# Patient Record
Sex: Male | Born: 1975 | Race: White | Hispanic: No | Marital: Married | Smoking: Never smoker
Health system: Southern US, Community
[De-identification: ages and names within clinical notes are randomized; demographics above are authoritative.]

## PROBLEM LIST (undated history)

## (undated) DIAGNOSIS — T4145XA Adverse effect of unspecified anesthetic, initial encounter: Secondary | ICD-10-CM

## (undated) DIAGNOSIS — T8859XA Other complications of anesthesia, initial encounter: Secondary | ICD-10-CM

## (undated) DIAGNOSIS — R0989 Other specified symptoms and signs involving the circulatory and respiratory systems: Secondary | ICD-10-CM

## (undated) HISTORY — PX: COLONOSCOPY: SHX174

---

## 2015-01-16 ENCOUNTER — Other Ambulatory Visit: Payer: Self-pay | Admitting: Otolaryngology

## 2015-01-16 DIAGNOSIS — R221 Localized swelling, mass and lump, neck: Secondary | ICD-10-CM

## 2015-01-24 ENCOUNTER — Ambulatory Visit
Admission: RE | Admit: 2015-01-24 | Discharge: 2015-01-24 | Disposition: A | Discharge status: Home / Self Care | Payer: 59 | Source: Ambulatory Visit | Attending: Otolaryngology | Admitting: Otolaryngology

## 2015-01-24 DIAGNOSIS — M542 Cervicalgia: Secondary | ICD-10-CM | POA: Insufficient documentation

## 2015-01-24 DIAGNOSIS — R221 Localized swelling, mass and lump, neck: Secondary | ICD-10-CM | POA: Diagnosis not present

## 2015-01-24 MED ORDER — IOHEXOL 350 MG/ML SOLN
75.0000 mL | Freq: Once | INTRAVENOUS | Status: AC | PRN
  Administered 2015-01-24: 75 mL via INTRAVENOUS

## 2015-03-30 ENCOUNTER — Encounter: Payer: Self-pay | Admitting: *Deleted

## 2015-04-05 NOTE — Discharge Instructions (Signed)
[facility] °[facility] °ENDOSCOPIC SINUS SURGERY °[facility] ° °What is Functional Endoscopic Sinus Surgery? ° The Surgery involves making the natural openings of the sinuses larger by removing the bony partitions that separate the sinuses from the nasal cavity.  The natural sinus lining is preserved as much as possible to allow the sinuses to resume normal function after the surgery.  In some patients nasal polyps (excessively swollen lining of the sinuses) may be removed to relieve obstruction of the sinus openings.  The surgery is performed through the nose using lighted scopes, which eliminates the need for incisions on the face.  A septoplasty is a different procedure which is sometimes performed with sinus surgery.  It involves straightening the boy partition that separates the two sides of your nose.  A crooked or deviated septum may need repair if is obstructing the sinuses or nasal airflow.  Turbinate reduction is also often performed during sinus surgery.  The turbinates are bony proturberances from the side walls of the nose which swell and can obstruct the nose in patients with sinus and allergy problems.  Their size can be surgically reduced to help relieve nasal obstruction. ° °What Can Sinus Surgery Do For Me? ° Sinus surgery can reduce the frequency of sinus infections requiring antibiotic treatment.  This can provide improvement in nasal congestion, post-nasal drainage, facial pressure and nasal obstruction.  Surgery will NOT prevent you from ever having an infection again, so it usually only for patients who get infections 4 or more times yearly requiring antibiotics, or for infections that do not clear with antibiotics.  It will not cure nasal allergies, so patients with allergies may still require medication to treat their allergies after surgery. Surgery may improve headaches related to sinusitis, however, some people will continue to  require medication to control sinus headaches related to allergies.  Surgery will do nothing for other forms of headache (migraine, tension or cluster). ° °What Are the Risks of Endoscopic Sinus Surgery? ° Current techniques allow surgery to be performed safely with little risk, however, there are rare complications that patients should be aware of.  Because the sinuses are located around the eyes, there is risk of eye injury, including blindness, though again, this would be quite rare. This is usually a result of bleeding behind the eye during surgery, which puts the vision oat risk, though there are treatments to protect the vision and prevent permanent disrupted by surgery causing a leak of the spinal fluid that surrounds the brain.  More serious complications would include bleeding inside the brain cavity or damage to the brain.  Again, all of these complications are uncommon, and spinal fluid leaks can be safely managed surgically if they occur.  The most common complication of sinus surgery is bleeding from the nose, which may require packing or cauterization of the nose.  Continued sinus have polyps may experience recurrence of the polyps requiring revision surgery.  Alterations of sense of smell or injury to the tear ducts are also rare complications.  ° °What is the Surgery Like, and what is the Recovery? ° The Surgery usually takes a couple of hours to perform, and is usually performed under a general anesthetic (completely asleep).  Patients are usually discharged home after a couple of hours.  Sometimes during surgery it is necessary to pack the nose to control bleeding, and the packing is left in place for 24 - 48 hours, and removed by your surgeon.    If a septoplasty was performed during the procedure, there is often a splint placed which must be removed after 5-7 days.   °Discomfort: Pain is usually mild to moderate, and can be controlled by prescription pain medication or acetaminophen (Tylenol).   Aspirin, Ibuprofen (Advil, Motrin), or Naprosyn (Aleve) should be avoided, as they can cause increased bleeding.  Most patients feel sinus pressure like they have a bad head cold for several days.  Sleeping with your head elevated can help reduce swelling and facial pressure, as can ice packs over the face.  A humidifier may be helpful to keep the mucous and blood from drying in the nose.  ° °Diet: There are no specific diet restrictions, however, you should generally start with clear liquids and a light diet of bland foods because the anesthetic can cause some nausea.  Advance your diet depending on how your stomach feels.  Taking your pain medication with food will often help reduce stomach upset which pain medications can cause. ° °Nasal Saline Irrigation: It is important to remove blood clots and dried mucous from the nose as it is healing.  This is done by having you irrigate the nose at least 3 - 4 times daily with a salt water solution.  We recommend using NeilMed Sinus Rinse (available at the drug store).  Fill the squeeze bottle with the solution, bend over a sink, and insert the tip of the squeeze bottle into the nose ½ of an inch.  Point the tip of the squeeze bottle towards the inside corner of the eye on the same side your irrigating.  Squeeze the bottle and gently irrigate the nose.  If you bend forward as you do this, most of the fluid will flow back out of the nose, instead of down your throat.   The solution should be warm, near body temperature, when you irrigate.   Each time you irrigate, you should use a full squeeze bottle.  ° °Note that if you are instructed to use Nasal Steroid Sprays at any time after your surgery, irrigate with saline BEFORE using the steroid spray, so you do not wash it all out of the nose. °Another product, Nasal Saline Gel (such as AYR Nasal Saline Gel) can be applied in each nostril 3 - 4 times daily to moisture the nose and reduce scabbing or crusting. ° °Bleeding:   Bloody drainage from the nose can be expected for several days, and patients are instructed to irrigate their nose frequently with salt water to help remove mucous and blood clots.  The drainage may be dark red or brown, though some fresh blood may be seen intermittently, especially after irrigation.  Do not blow you nose, as bleeding may occur. If you must sneeze, keep your mouth open to allow air to escape through your mouth. ° °If heavy bleeding occurs: Irrigate the nose with saline to rinse out clots, then spray the nose 3 - 4 times with Afrin Nasal Decongestant Spray.  The spray will constrict the blood vessels to slow bleeding.  Pinch the lower half of your nose shut to apply pressure, and lay down with your head elevated.  Ice packs over the nose may help as well. If bleeding persists despite these measures, you should notify your doctor.  Do not use the Afrin routinely to control nasal congestion after surgery, as it can result in worsening congestion and may affect healing.  ° ° ° °Activity: Return to work varies among patients. Most patients will be   out of work at least 5 - 7 days to recover.  Patient may return to work after they are off of narcotic pain medication, and feeling well enough to perform the functions of their job.  Patients must avoid heavy lifting (over 10 pounds) or strenuous physical for 2 weeks after surgery, so your employer may need to assign you to light duty, or keep you out of work longer if light duty is not possible.  NOTE: you should not drive, operate dangerous machinery, do any mentally demanding tasks or make any important legal or financial decisions while on narcotic pain medication and recovering from the general anesthetic.  °  °Call Your Doctor Immediately if You Have Any of the Following: °1. Bleeding that you cannot control with the above measures °2. Loss of vision, double vision, bulging of the eye or black eyes. °3. Fever over 101 degrees °4. Neck stiffness with  severe headache, fever, nausea and change in mental state. °You are always encourage to call anytime with concerns, however, please call with requests for pain medication refills during office hours. ° °Office Endoscopy: During follow-up visits your doctor will remove any packing or splints that may have been placed and evaluate and clean your sinuses endoscopically.  Topical anesthetic will be used to make this as comfortable as possible, though you may want to take your pain medication prior to the visit.  How often this will need to be done varies from patient to patient.  After complete recovery from the surgery, you may need follow-up endoscopy from time to time, particularly if there is concern of recurrent infection or nasal polyps. ° °General Anesthesia, Adult, Care After °Refer to this sheet in the next few weeks. These instructions provide you with information on caring for yourself after your procedure. Your health care provider may also give you more specific instructions. Your treatment has been planned according to current medical practices, but problems sometimes occur. Call your health care provider if you have any problems or questions after your procedure. °WHAT TO EXPECT AFTER THE PROCEDURE °After the procedure, it is typical to experience: °· Sleepiness. °· Nausea and vomiting. °HOME CARE INSTRUCTIONS °· For the first 24 hours after general anesthesia: °¨ Have a responsible person with you. °¨ Do not drive a car. If you are alone, do not take public transportation. °¨ Do not drink alcohol. °¨ Do not take medicine that has not been prescribed by your health care provider. °¨ Do not sign important papers or make important decisions. °¨ You may resume a normal diet and activities as directed by your health care provider. °· Change bandages (dressings) as directed. °· If you have questions or problems that seem related to general anesthesia, call the hospital and ask for the anesthetist or  anesthesiologist on call. °SEEK MEDICAL CARE IF: °· You have nausea and vomiting that continue the day after anesthesia. °· You develop a rash. °SEEK IMMEDIATE MEDICAL CARE IF:  °· You have difficulty breathing. °· You have chest pain. °· You have any allergic problems. °  °This information is not intended to replace advice given to you by your health care provider. Make sure you discuss any questions you have with your health care provider. °  °Document Released: 08/11/2000 Document Revised: 05/26/2014 Document Reviewed: 09/03/2011 °Elsevier Interactive Patient Education ©2016 [employer] ° °

## 2015-05-03 ENCOUNTER — Encounter: Payer: Self-pay | Admitting: *Deleted

## 2015-05-09 ENCOUNTER — Ambulatory Visit
Admission: RE | Admit: 2015-05-09 | Discharge: 2015-05-09 | Disposition: A | Discharge status: Home / Self Care | Payer: 59 | Source: Ambulatory Visit | Attending: Otolaryngology | Admitting: Otolaryngology

## 2015-05-09 ENCOUNTER — Encounter
Admission: RE | Disposition: A | Discharge status: Home / Self Care | Payer: 59 | Source: Ambulatory Visit | Attending: Otolaryngology

## 2015-05-09 ENCOUNTER — Ambulatory Visit: Payer: 59 | Admitting: Anesthesiology

## 2015-05-09 DIAGNOSIS — J320 Chronic maxillary sinusitis: Secondary | ICD-10-CM | POA: Insufficient documentation

## 2015-05-09 HISTORY — PX: MAXILLARY ANTROSTOMY: SHX2003

## 2015-05-09 HISTORY — PX: IMAGE GUIDED SINUS SURGERY: SHX6570

## 2015-05-09 HISTORY — DX: Adverse effect of unspecified anesthetic, initial encounter: T41.45XA

## 2015-05-09 HISTORY — DX: Other complications of anesthesia, initial encounter: T88.59XA

## 2015-05-09 HISTORY — DX: Other specified symptoms and signs involving the circulatory and respiratory systems: R09.89

## 2015-05-09 SURGERY — SINUS SURGERY, WITH IMAGING GUIDANCE
Anesthesia: General | Site: Nose | Laterality: Right | Wound class: Clean Contaminated

## 2015-05-09 MED ORDER — DEXAMETHASONE SODIUM PHOSPHATE 4 MG/ML IJ SOLN
INTRAMUSCULAR | Status: DC | PRN
  Administered 2015-05-09: 10 mg via INTRAVENOUS

## 2015-05-09 MED ORDER — PROPOFOL 10 MG/ML IV BOLUS
INTRAVENOUS | Status: DC | PRN
  Administered 2015-05-09: 50 mg via INTRAVENOUS
  Administered 2015-05-09: 150 mg via INTRAVENOUS

## 2015-05-09 MED ORDER — OXYCODONE HCL 5 MG PO TABS: 5.0000 mg | ORAL_TABLET | Freq: Once | ORAL | Status: AC | PRN

## 2015-05-09 MED ORDER — LACTATED RINGERS IV SOLN: 500.0000 mL | INTRAVENOUS | Status: DC

## 2015-05-09 MED ORDER — HYDROCODONE-ACETAMINOPHEN 5-325 MG PO TABS: 1.0000 | ORAL_TABLET | ORAL | Status: DC | PRN

## 2015-05-09 MED ORDER — LIDOCAINE HCL (CARDIAC) 20 MG/ML IV SOLN
INTRAVENOUS | Status: DC | PRN
  Administered 2015-05-09: 40 mg via INTRAVENOUS

## 2015-05-09 MED ORDER — ACETAMINOPHEN 160 MG/5ML PO SOLN: 325.0000 mg | ORAL | Status: DC | PRN

## 2015-05-09 MED ORDER — GLYCOPYRROLATE 0.2 MG/ML IJ SOLN
INTRAMUSCULAR | Status: DC | PRN
  Administered 2015-05-09: .1 mg via INTRAVENOUS

## 2015-05-09 MED ORDER — LACTATED RINGERS IV SOLN
INTRAVENOUS | Status: DC
  Administered 2015-05-09: 07:00:00 via INTRAVENOUS

## 2015-05-09 MED ORDER — LIDOCAINE-EPINEPHRINE 1 %-1:100000 IJ SOLN
INTRAMUSCULAR | Status: DC | PRN
  Administered 2015-05-09: 5 mL

## 2015-05-09 MED ORDER — SULFAMETHOXAZOLE-TRIMETHOPRIM 800-160 MG PO TABS: 1.0000 | ORAL_TABLET | Freq: Two times a day (BID) | ORAL | Status: DC

## 2015-05-09 MED ORDER — PROMETHAZINE HCL 12.5 MG PO TABS: 12.5000 mg | ORAL_TABLET | Freq: Four times a day (QID) | ORAL | Status: DC | PRN

## 2015-05-09 MED ORDER — ACETAMINOPHEN 10 MG/ML IV SOLN
INTRAVENOUS | Status: DC | PRN
  Administered 2015-05-09: 1000 mg via INTRAVENOUS

## 2015-05-09 MED ORDER — ONDANSETRON HCL 4 MG/2ML IJ SOLN
INTRAMUSCULAR | Status: DC | PRN
  Administered 2015-05-09: 4 mg via INTRAVENOUS

## 2015-05-09 MED ORDER — ONDANSETRON HCL 4 MG/2ML IJ SOLN: 4.0000 mg | Freq: Once | INTRAMUSCULAR | Status: DC | PRN

## 2015-05-09 MED ORDER — SUCCINYLCHOLINE CHLORIDE 20 MG/ML IJ SOLN
INTRAMUSCULAR | Status: DC | PRN
  Administered 2015-05-09: 100 mg via INTRAVENOUS

## 2015-05-09 MED ORDER — ACETAMINOPHEN 325 MG PO TABS: 325.0000 mg | ORAL_TABLET | ORAL | Status: DC | PRN

## 2015-05-09 MED ORDER — OXYCODONE HCL 5 MG/5ML PO SOLN
5.0000 mg | Freq: Once | ORAL | Status: AC | PRN
  Administered 2015-05-09: 5 mg via ORAL

## 2015-05-09 MED ORDER — FENTANYL CITRATE (PF) 100 MCG/2ML IJ SOLN
INTRAMUSCULAR | Status: DC | PRN
  Administered 2015-05-09: 25 ug via INTRAVENOUS
  Administered 2015-05-09: 100 ug via INTRAVENOUS

## 2015-05-09 MED ORDER — OXYMETAZOLINE HCL 0.05 % NA SOLN
NASAL | Status: DC | PRN
  Administered 2015-05-09: 1 via TOPICAL

## 2015-05-09 MED ORDER — MIDAZOLAM HCL 5 MG/5ML IJ SOLN
INTRAMUSCULAR | Status: DC | PRN
  Administered 2015-05-09: 2 mg via INTRAVENOUS

## 2015-05-09 MED ORDER — HYDROMORPHONE HCL 1 MG/ML IJ SOLN: 0.2500 mg | INTRAMUSCULAR | Status: DC | PRN

## 2015-05-09 SURGICAL SUPPLY — 28 items
BALLOON SINUPLASTY SYSTEM (BALLOONS) IMPLANT
BATTERY INSTRU NAVIGATION (MISCELLANEOUS) ×16 IMPLANT
BLADE IRRIGATOR 40D CVD (IRRIGATION / IRRIGATOR) ×4 IMPLANT
CANISTER SUCT 1200ML W/VALVE (MISCELLANEOUS) ×4 IMPLANT
COAG SUCT 10F 3.5MM HAND CTRL (MISCELLANEOUS) ×4 IMPLANT
DEVICE INFLATION SEID (MISCELLANEOUS) IMPLANT
DRAPE HEAD BAR (DRAPES) ×4 IMPLANT
DRESSING NASL FOAM PST OP SINU (MISCELLANEOUS) ×2 IMPLANT
DRSG NASAL 4CM NASOPORE (MISCELLANEOUS) IMPLANT
DRSG NASAL FOAM POST OP SINU (MISCELLANEOUS) ×4
GLOVE BIO SURGEON STRL SZ7.5 (GLOVE) ×12 IMPLANT
IRRIGATOR 4MM STR (IRRIGATION / IRRIGATOR) ×4 IMPLANT
IV NS 500ML (IV SOLUTION) ×2
IV NS 500ML BAXH (IV SOLUTION) ×2 IMPLANT
KIT ROOM TURNOVER OR (KITS) ×4 IMPLANT
NAVIGATION MASK REG  ST (MISCELLANEOUS) ×4 IMPLANT
NS IRRIG 500ML POUR BTL (IV SOLUTION) IMPLANT
PACK DRAPE NASAL/ENT (PACKS) ×4 IMPLANT
PACKING NASAL EPIS 4X2.4 XEROG (MISCELLANEOUS) ×4 IMPLANT
PAD GROUND ADULT SPLIT (MISCELLANEOUS) ×4 IMPLANT
PATTIES SURGICAL .5 X3 (DISPOSABLE) ×4 IMPLANT
SINUPLASTY SPHENOID GUIDE (MISCELLANEOUS) IMPLANT
SOL ANTI-FOG 6CC FOG-OUT (MISCELLANEOUS) ×2 IMPLANT
SOL FOG-OUT ANTI-FOG 6CC (MISCELLANEOUS) ×2
STRAP BODY AND KNEE 60X3 (MISCELLANEOUS) ×8 IMPLANT
SYR BULB EAR ULCER 3OZ GRN STR (SYRINGE) ×4 IMPLANT
SYRINGE 10CC LL (SYRINGE) ×4 IMPLANT
WATER STERILE IRR 500ML POUR (IV SOLUTION) ×4 IMPLANT

## 2015-05-09 NOTE — Transfer of Care (Signed)
Immediate Anesthesia Transfer of Care Note  Patient: Matthew Levy  Procedure(s) Performed: Procedure(s) with comments: IMAGE GUIDED SINUS SURGERY (N/A) - GAVE DISK TO [facility] 11/10 MAXILLARY ANTROSTOMY (Right) - NO SPECIMENS SENT PER MD  Patient Location: PACU  Anesthesia Type: General  Level of Consciousness: awake, alert  and patient cooperative  Airway and Oxygen Therapy: Patient Spontanous Breathing and Patient connected to supplemental oxygen  Post-op Assessment: Post-op Vital signs reviewed, Patient's Cardiovascular Status Stable, Respiratory Function Stable, Patent Airway and No signs of Nausea or vomiting  Post-op Vital Signs: Reviewed and stable  Complications: No apparent anesthesia complications

## 2015-05-09 NOTE — H&P (Signed)
..  History and Physical paper copy reviewed and updated date of procedure and will be scanned into system.  

## 2015-05-09 NOTE — Anesthesia Postprocedure Evaluation (Signed)
Anesthesia Post Note  Patient: Matthew Levy  Procedure(s) Performed: Procedure(s) (LRB): IMAGE GUIDED SINUS SURGERY (N/A) MAXILLARY ANTROSTOMY (Right)  Patient location during evaluation: PACU Anesthesia Type: General Level of consciousness: awake and alert Pain management: pain level controlled Vital Signs Assessment: post-procedure vital signs reviewed and stable Respiratory status: spontaneous breathing, nonlabored ventilation, respiratory function stable and patient connected to nasal cannula oxygen Cardiovascular status: blood pressure returned to baseline and stable Postop Assessment: no signs of nausea or vomiting Anesthetic complications: no    DANIEL D KOVACS

## 2015-05-09 NOTE — Op Note (Signed)
..  05/09/2015  8:38 AM    Randa SpikeForrest Rosaura CarpenterBlackwood  [id number]   Pre-Op Dx:  Chronic Rhinosinusitis refractory to medical treatment  Post-op Dx: Same  Proc:   1)  Image Guided Sinus Surgery,  2)  Right Maxillary Antrostomy   Surg:  Danyelle [facility]over  Anes:  General  EBL:  50  Comp:  None  Findings: purulence and accessory ostia of right maxillary sinus  Procedure: After the patient was identified in holding and the benefits of the procedure were reviewed as well as the consent and risks.  The patient was taken to the operating room and with the patient in a comfortable supine position,  general orotracheal anesthesia was induced without difficulty.  A proper time-out was performed.  The Stryker image guidance system was set up and calibrated in the normal fashion with an acceptable error of 0.46mm.       The patient next received preoperative Afrin spray for topical decongestion and vasoconstriction and 1% Xylocaine with 1:100,000 epinephrine, 3 cc's, was infiltrated into the inferior turbinates, septum, and anterior middle turbinates bilaterally.  Several minutes were allowed for this to take effect.  Cottoniod pledgets soaked in Afrin were placed into both nasal cavities and left while the patient was prepped and draped in the standard fashion.  The materials were removed from the nose and observed to be intact and correct in number.  The nose was next inspected with a zero degree endoscope and the middle turbinates were medialized and afrin soaked pledgets were placed lateral to the turbinates for approximately one minute.  At this time attention was directed to the patient's right maxillary sinus.  On the right, a ball tipped probe was placed through the natural ostia and this was used to create a larger opening.  An accessory ostia was noted with purulence.  Using a Diego microdebrider, the maxillary antrostomy was enlarged for a widely patent maxillary antrostomy.  Hemostasis was  performed with topical Afrin soaked pledgets.  Visualization with a zero degree endoscope was used to examine the maxillary antrostomy which were noted to be widely patent and in continuity with the natural os bilaterally.  At this time with the diseased sinuses opened, the patient's nasal cavity was examinated and copiously irrigated with sterile saline.  Meticulous hemostasis was continued and all sinuses were examined and noted to be widely patent.    Stamberger sinufoam and Xerogel was placed lateral to the middle turbinate on the right side to aid in continued hemostasis and medialization of the middle turbinate.  Care of the patient at this time was transferred to anesthesia and was extubated and taken to PACU in good condition.  Dispo:   PACU to home  Plan: Ice, elevation, narcotic analgesia and prophylactic antibiotics.  We will reevaluate the patient in the office in 7 days.  Return to work in 7-10 days, strenuous activities in two weeks.   Branden Vine 05/09/2015 8:38 AM

## 2015-05-09 NOTE — Anesthesia Preprocedure Evaluation (Signed)
Anesthesia Evaluation  Patient identified by MRN, date of birth, ID band Patient awake    Reviewed: Allergy & Precautions, H&P , NPO status , Patient's Chart, lab work & pertinent test results, reviewed documented beta blocker date and time   History of Anesthesia Complications (+) history of anesthetic complications  Airway Mallampati: III  TM Distance: >3 FB Neck ROM: full    Dental no notable dental hx.    Pulmonary neg pulmonary ROS,    Pulmonary exam normal breath sounds clear to auscultation       Cardiovascular Exercise Tolerance: Good negative cardio ROS   Rhythm:regular Rate:Normal     Neuro/Psych negative neurological ROS  negative psych ROS   GI/Hepatic negative GI ROS, Neg liver ROS,   Endo/Other  negative endocrine ROS  Renal/GU negative Renal ROS  negative genitourinary   Musculoskeletal   Abdominal   Peds  Hematology negative hematology ROS (+)   Anesthesia Other Findings   Reproductive/Obstetrics negative OB ROS                             Anesthesia Physical Anesthesia Plan  ASA: I  Anesthesia Plan: General   Post-op Pain Management:    Induction:   Airway Management Planned:   Additional Equipment:   Intra-op Plan:   Post-operative Plan:   Informed Consent: I have reviewed the patients History and Physical, chart, labs and discussed the procedure including the risks, benefits and alternatives for the proposed anesthesia with the patient or authorized representative who has indicated his/her understanding and acceptance.     Plan Discussed with: CRNA  Anesthesia Plan Comments:         Anesthesia Quick Evaluation

## 2015-05-09 NOTE — Anesthesia Procedure Notes (Signed)
Procedure Name: Intubation Date/Time: 05/09/2015 7:33 AM Performed by: Jimmy PicketAMYOT, Kaoru Benda Pre-anesthesia Checklist: Patient identified, Emergency Drugs available, Suction available, Patient being monitored and Timeout performed Patient Re-evaluated:Patient Re-evaluated prior to inductionOxygen Delivery Method: Circle system utilized Preoxygenation: Pre-oxygenation with 100% oxygen Intubation Type: IV induction Ventilation: Mask ventilation without difficulty Laryngoscope Size: Miller and 3 Grade View: Grade I Tube type: Oral Rae Tube size: 7.5 mm Number of attempts: 1 Placement Confirmation: ETT inserted through vocal cords under direct vision,  positive ETCO2 and breath sounds checked- equal and bilateral Tube secured with: Tape Dental Injury: Teeth and Oropharynx as per pre-operative assessment

## 2015-05-10 ENCOUNTER — Encounter: Payer: Self-pay | Admitting: Otolaryngology

## 2015-07-16 ENCOUNTER — Encounter: Payer: Self-pay | Admitting: Emergency Medicine

## 2015-07-16 ENCOUNTER — Ambulatory Visit
Admission: EM | Admit: 2015-07-16 | Discharge: 2015-07-16 | Disposition: A | Discharge status: Home / Self Care | Payer: 59 | Attending: Family Medicine | Admitting: Family Medicine

## 2015-07-16 DIAGNOSIS — R112 Nausea with vomiting, unspecified: Secondary | ICD-10-CM | POA: Diagnosis not present

## 2015-07-16 DIAGNOSIS — B349 Viral infection, unspecified: Secondary | ICD-10-CM

## 2015-07-16 LAB — URINALYSIS COMPLETE WITH MICROSCOPIC (ARMC ONLY)
Bacteria, UA: NONE SEEN
Bilirubin Urine: NEGATIVE
Glucose, UA: NEGATIVE mg/dL
Ketones, ur: NEGATIVE mg/dL
Leukocytes, UA: NEGATIVE
Nitrite: NEGATIVE
Protein, ur: NEGATIVE mg/dL
Specific Gravity, Urine: 1.015 (ref 1.005–1.030)
Squamous Epithelial / LPF: NONE SEEN
pH: 7 (ref 5.0–8.0)

## 2015-07-16 LAB — COMPREHENSIVE METABOLIC PANEL
ALT: 39 U/L (ref 17–63)
AST: 30 U/L (ref 15–41)
Albumin: 4.1 g/dL (ref 3.5–5.0)
Alkaline Phosphatase: 94 U/L (ref 38–126)
Anion gap: 6 (ref 5–15)
BUN: 11 mg/dL (ref 6–20)
CO2: 27 mmol/L (ref 22–32)
Calcium: 8.3 mg/dL — ABNORMAL LOW (ref 8.9–10.3)
Chloride: 103 mmol/L (ref 101–111)
Creatinine, Ser: 0.85 mg/dL (ref 0.61–1.24)
GFR calc Af Amer: >60 mL/min (ref >60)
GFR calc non Af Amer: >60 mL/min (ref >60)
Glucose, Bld: 112 mg/dL — ABNORMAL HIGH (ref 65–99)
Potassium: 3.5 mmol/L (ref 3.5–5.1)
Sodium: 136 mmol/L (ref 135–145)
Total Bilirubin: 0.6 mg/dL (ref 0.3–1.2)
Total Protein: 7.6 g/dL (ref 6.5–8.1)

## 2015-07-16 LAB — CBC WITH DIFFERENTIAL/PLATELET
Basophils Absolute: 0 10*3/uL (ref 0–0.1)
Basophils Relative: 0 %
Eosinophils Absolute: 0 10*3/uL (ref 0–0.7)
Eosinophils Relative: 0 %
HCT: 47.1 % (ref 40.0–52.0)
Hemoglobin: 16.2 g/dL (ref 13.0–18.0)
Lymphocytes Relative: 19 %
Lymphs Abs: 0.8 10*3/uL — ABNORMAL LOW (ref 1.0–3.6)
MCH: 30 pg (ref 26.0–34.0)
MCHC: 34.5 g/dL (ref 32.0–36.0)
MCV: 86.9 fL (ref 80.0–100.0)
Monocytes Absolute: 0.5 10*3/uL (ref 0.2–1.0)
Monocytes Relative: 13 %
Neutro Abs: 2.8 10*3/uL (ref 1.4–6.5)
Neutrophils Relative %: 68 %
Platelets: 190 10*3/uL (ref 150–440)
RBC: 5.41 MIL/uL (ref 4.40–5.90)
RDW: 12.5 % (ref 11.5–14.5)
WBC: 4.2 10*3/uL (ref 3.8–10.6)

## 2015-07-16 LAB — RAPID INFLUENZA A&B ANTIGENS
Influenza A (ARMC): NOT DETECTED — AB
Influenza B (ARMC): NOT DETECTED — AB

## 2015-07-16 MED ORDER — ONDANSETRON 8 MG PO TBDP
8.0000 mg | ORAL_TABLET | Freq: Once | ORAL | Status: AC
  Administered 2015-07-16: 8 mg via ORAL

## 2015-07-16 MED ORDER — ONDANSETRON 8 MG PO TBDP: 8.0000 mg | ORAL_TABLET | Freq: Two times a day (BID) | ORAL | Status: DC

## 2015-07-16 MED ORDER — ACETAMINOPHEN 500 MG PO TABS: 500.0000 mg | ORAL_TABLET | Freq: Once | ORAL | Status: DC

## 2015-07-16 MED ORDER — SODIUM CHLORIDE 0.9 % IV SOLN
Freq: Once | INTRAVENOUS | Status: AC
  Administered 2015-07-16: 16:00:00 via INTRAVENOUS

## 2015-07-16 MED ORDER — IBUPROFEN 800 MG PO TABS
800.0000 mg | ORAL_TABLET | Freq: Once | ORAL | Status: AC
  Administered 2015-07-16: 800 mg via ORAL

## 2015-07-16 MED ORDER — ONDANSETRON HCL 4 MG/2ML IJ SOLN
8.0000 mg | Freq: Once | INTRAMUSCULAR | Status: DC
Start: 2015-07-16 — End: 2015-07-16

## 2015-07-16 NOTE — Discharge Instructions (Signed)

## 2015-07-16 NOTE — ED Provider Notes (Signed)
CSN: [id number]     Arrival date & time 07/16/15  1124 History   First MD Initiated Contact with Patient 07/16/15 1404     Chief Complaint  Patient presents with  . Emesis  . Cough   (Consider location/radiation/quality/duration/timing/severity/associated sxs/prior Treatment) HPI   40 year old gentleman who presents with a six-day history of fever and chills and vomiting but without diarrhea or abdominal pain. He states that he was prescribed Tamiflu but gave it to his wife instead because she was also sick. She is continuing to take the Tamiflu at this time. He began vomiting on Wednesday and is really not kept anything down since then. He has not had a bowel movement since last Thursday. He has been febrile with temperatures up to 102. He states he went to [facility] emergency department today but was told it cost him $900 to be seen so he came here instead. Here he was given a Zofran ODT and has been able to have hold down some fluids and crackers he hasn't been able to do at home. Vital signs today shows a temperature of 99.9 pulse 100 respirations 16 blood pressure 106/65 and O2 sats of 98% on room air  Past Medical History  Diagnosis Date  . Sinus complaint   . Complication of anesthesia     Pt was told he was "restless" and "hard to keep asleep" during procedure   Past Surgical History  Procedure Laterality Date  . Colonoscopy    . Image guided sinus surgery N/A 05/09/2015    Procedure: IMAGE GUIDED SINUS SURGERY;  Surgeon: Bud Face, MD;  Location: [facility];  Service: ENT;  Laterality: N/A;  GAVE DISK TO [facility] 11/10  . Maxillary antrostomy Right 05/09/2015    Procedure: MAXILLARY ANTROSTOMY;  Surgeon: Bud Face, MD;  Location: [facility];  Service: ENT;  Laterality: Right;  NO SPECIMENS SENT PER MD   Family History  Problem Relation Age of Onset  . Clotting disorder Mother    Social History  Substance Use Topics  . Smoking status: Never Smoker    . Smokeless tobacco: None  . Alcohol Use: No    Review of Systems  Constitutional: Positive for fever, chills, activity change, appetite change and fatigue.  HENT: Positive for congestion.   Gastrointestinal: Positive for nausea, vomiting and constipation. Negative for abdominal pain, diarrhea, blood in stool and abdominal distention.  All other systems reviewed and are negative.   Allergies  Pork-derived products  Home Medications   Prior to Admission medications   Medication Sig Start Date End Date Taking? Authorizing Provider  HYDROcodone-acetaminophen (NORCO) 5-325 MG tablet Take 1 tablet by mouth every 4 (four) hours as needed for moderate pain. 05/09/15   Bud Face, MD  ondansetron (ZOFRAN ODT) 8 MG disintegrating tablet Take 1 tablet (8 mg total) by mouth 2 (two) times daily. 07/16/15   Lutricia Feil, PA-C  promethazine (PHENERGAN) 12.5 MG tablet Take 1 tablet (12.5 mg total) by mouth every 6 (six) hours as needed for nausea or vomiting. 05/09/15   Bud Face, MD  sulfamethoxazole-trimethoprim (BACTRIM DS,SEPTRA DS) 800-160 MG tablet Take 1 tablet by mouth 2 (two) times daily. 05/09/15   Bud Face, MD   Meds Ordered and Administered this Visit   Medications  acetaminophen (TYLENOL) tablet 500 mg (not administered)  ondansetron (ZOFRAN-ODT) disintegrating tablet 8 mg (8 mg Oral Given 07/16/15 1247)  0.9 %  sodium chloride infusion ( Intravenous Stopped 07/16/15 1626)  ibuprofen (ADVIL,MOTRIN) tablet 800 mg (  800 mg Oral Given 07/16/15 1605)    BP 106/65 mmHg  Pulse 100  Temp(Src) 101.1 F (38.4 C) (Tympanic)  Resp 16  Ht 6' (1.829 m)  Wt 230 lb (104.327 kg)  BMI 31.19 kg/m2  SpO2 98% No data found.   Physical Exam  Constitutional: He is oriented to person, place, and time. He appears well-developed and well-nourished. No distress.  HENT:  Head: Normocephalic and atraumatic.  Nose: Nose normal.  Mouth/Throat: Oropharynx is clear and moist. No  oropharyngeal exudate.  Patient has loss of the light reflex bilaterally. TMs are dark. He does have some mild tenderness percussion in the frontal sinuses. Oropharynx is benign. There is no cervical adenopathy present.  Eyes: Conjunctivae are normal. Pupils are equal, round, and reactive to light.  Neck: Normal range of motion. Neck supple.  Pulmonary/Chest: Effort normal and breath sounds normal. No respiratory distress. He has no wheezes. He has no rales.  Abdominal: Soft. Bowel sounds are normal. He exhibits no distension and no mass. There is no tenderness. There is no rebound and no guarding.  Musculoskeletal: Normal range of motion. He exhibits no edema or tenderness.  Lymphadenopathy:    He has no cervical adenopathy.  Neurological: He is alert and oriented to person, place, and time.  Skin: Skin is warm and dry. He is not diaphoretic. No erythema.  Psychiatric: He has a normal mood and affect. His behavior is normal. Judgment and thought content normal.  Nursing note and vitals reviewed.   ED Course  Procedures (including critical care time)  Labs Review Labs Reviewed  RAPID INFLUENZA A&B ANTIGENS ([facility] ONLY) - Abnormal; Notable for the following:    Influenza A [facility]) NOT DETECTED (*)    Influenza B ([facility]) NOT DETECTED (*)    All other components within normal limits  CBC WITH DIFFERENTIAL/PLATELET - Abnormal; Notable for the following:    Lymphs Abs 0.8 (*)    All other components within normal limits  COMPREHENSIVE METABOLIC PANEL - Abnormal; Notable for the following:    Glucose, Bld 112 (*)    Calcium 8.3 (*)    All other components within normal limits  URINALYSIS COMPLETEWITH MICROSCOPIC ([facility] ONLY) - Abnormal; Notable for the following:    Hgb urine dipstick TRACE (*)    All other components within normal limits    Imaging Review No results found.   Visual Acuity Review  Right Eye Distance:   Left Eye Distance:   Bilateral Distance:    Right Eye Near:    Left Eye Near:    Bilateral Near:     12:47 Medication Given HM  ondansetron (ZOFRAN-ODT) disintegrating tablet 8 mg - Dose: 8 mg ; Route: Oral ; Scheduled Time: 1300     15:41 Peripheral IV 07/16/15 Left Antecubital Placed SH  Removal Date/Time: 07/16/15 1651 Placement Date/Time: 07/16/15 1541  Ultrasound Used?: No Size (Gauge): 20 G Orientation: Left Location: Antecubital Site Prep: Chlorhexidine Local Anesthetic: None Person Inserting Catheter: sm holmes Insertio...     15:42:24 Peripheral IV 07/16/15 Left Antecubital Assessment SH  Site Assessment: Clean; Dry; Intact ; Dressing Status: Clean; Dry; Intact ; Line Status: Infusing ; Dressing Type: Transparent      15:43 Medication New Bag/Given SH  0.9 % sodium chloride infusion - Rate: 150 mL/hr ; Route: Intravenous ; Line: Peripheral IV 07/16/15 Left Antecubital ; Scheduled Time:      16:05 Medication Given HM  ibuprofen (ADVIL,MOTRIN) tablet 800 mg - Dose: 800 mg ; Route: Oral ;  Scheduled Time: 16         MDM   1. Viral syndrome   2. Non-intractable vomiting with nausea, vomiting of unspecified type    Discharge Medication List as of 07/16/2015  5:24 PM    START taking these medications   Details  ondansetron (ZOFRAN ODT) 8 MG disintegrating tablet Take 1 tablet (8 mg total) by mouth 2 (two) times daily., Starting 07/16/2015, Until Discontinued, Normal      Plan: 1. Test/x-ray results and diagnosis reviewed with patient 2. rx as per orders; risks, benefits, potential side effects reviewed with patient 3. Recommend supportive treatment with fluids and rest. He must drink as much fluids as he is able to have his urine clear. I recommended he follow-up with his primary care physician this week. Since he needs to go to the emergency room. His temperature on discharge was 101 but he had just received 100 mg of Motrin one hour prior. I've advised him to take Motrin or Tylenol around the clock and have given him Zofran  for nausea and vomiting. 4. F/u prn if symptoms worsen or don't improve     Lutricia Feil, PA-C 07/16/15 1731

## 2015-07-16 NOTE — ED Notes (Signed)
Patient c/o vomiting since Wed.  Patient  reports fever and chills. Patient denies diarrhea. Patient denies any other cold symptoms.

## 2016-09-19 IMAGING — CT CT NECK W/ CM
4 of 5 series · 15 of 33 positions shown, 17 images · IV contrast (omnipaque)
Comparison: None.

CLINICAL DATA: Swelling of the left neck, 3 months duration. Area
of concern marked on the scan.

EXAM:
CT NECK WITH CONTRAST
TECHNIQUE: Multidetector CT imaging of the neck was performed using the
standard protocol following the bolus administration of intravenous
contrast.
CONTRAST:  75mL OMNIPAQUE IOHEXOL 350 MG/ML SOLN

[Series 2: axial · axial · 0.56mm/px · z∈[-286,-166]mm · 3 of 121 slices shown]
[im 31/121  bone]
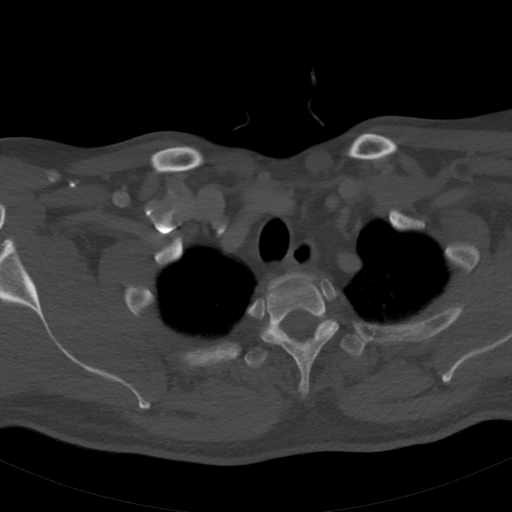
[im 61/121  bone]
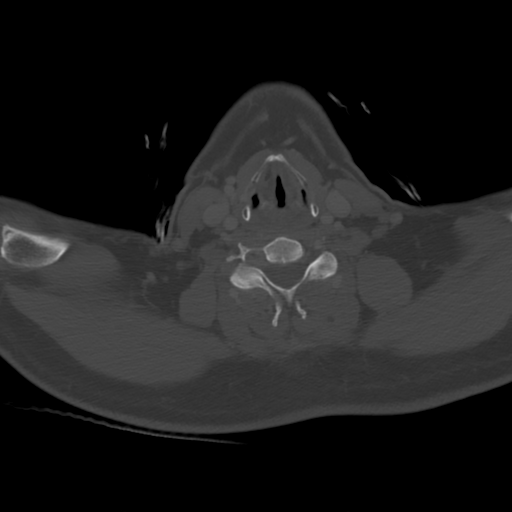
[im 91/121  bone]
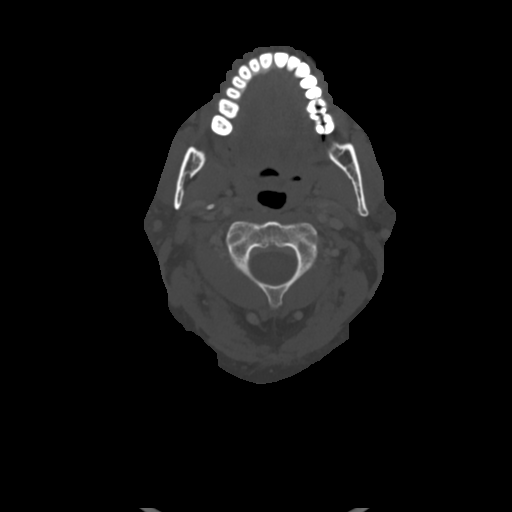

[Series 4: sag neck · sagittal · 0.47mm/px · 5 of 143 slices shown, 6 images]
[im 48/143  bone]
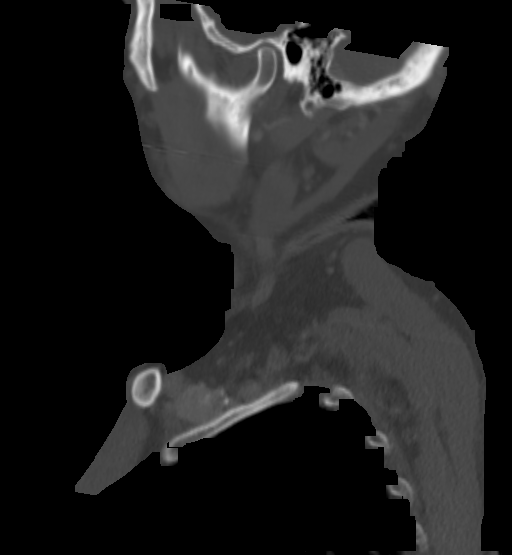
[im 60/143  bone]
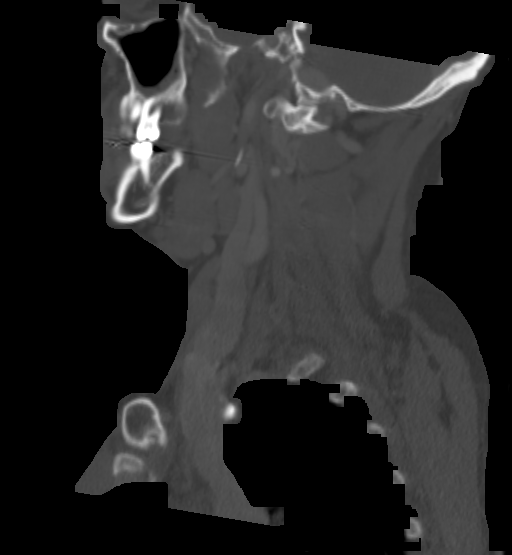
[im 72/143  soft-tissue]
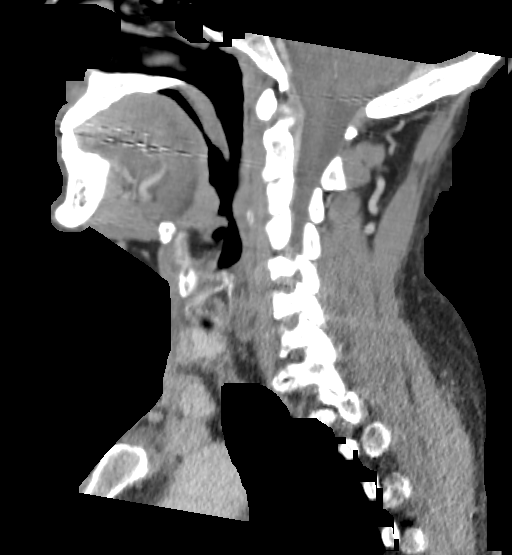
[im 72/143  bone]
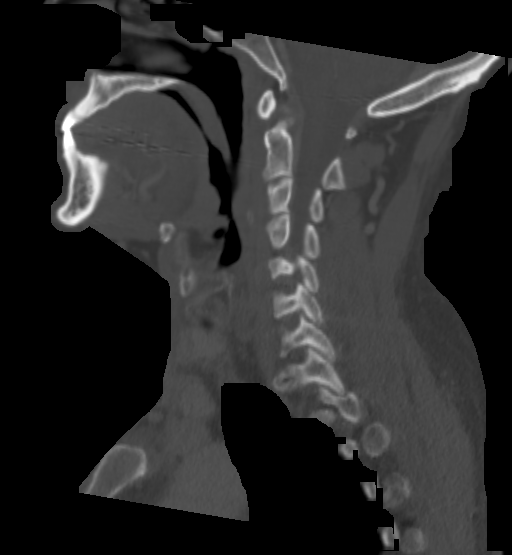
[im 83/143  bone]
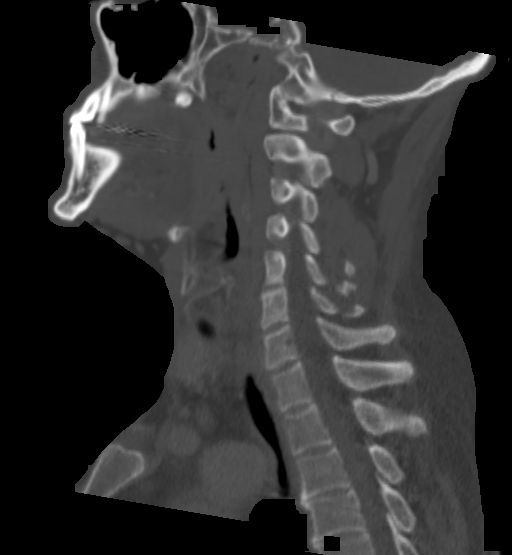
[im 95/143  bone]
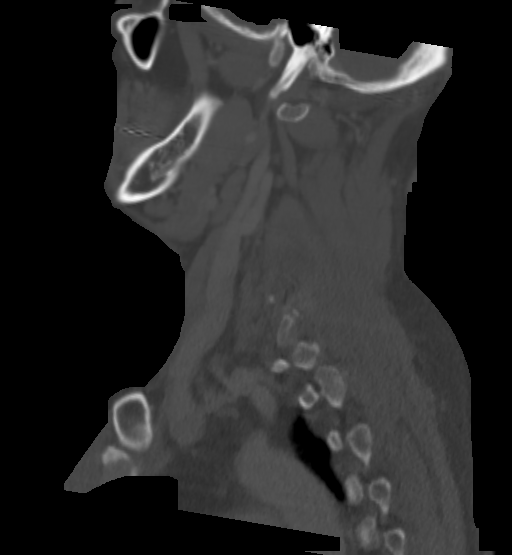

[Series 5: cor neck · coronal · 0.53mm/px · 3 of 116 slices shown]
[im 25/116  bone]
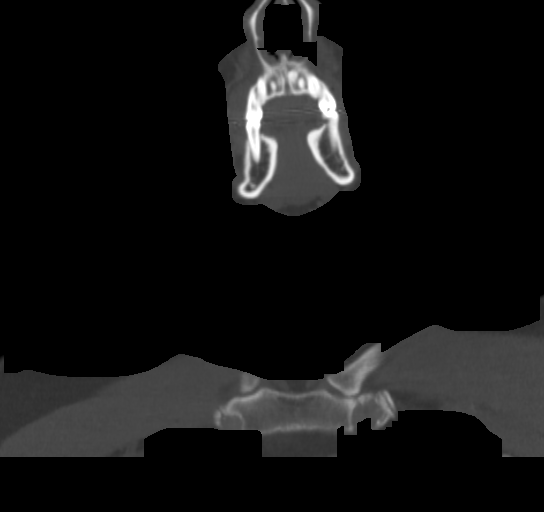
[im 47/116  bone]
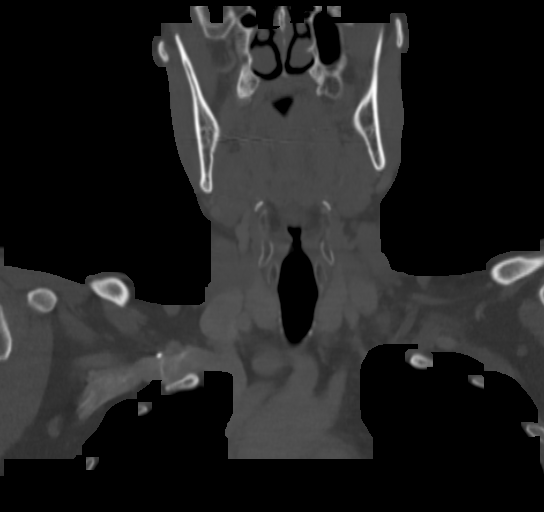
[im 69/116  bone]
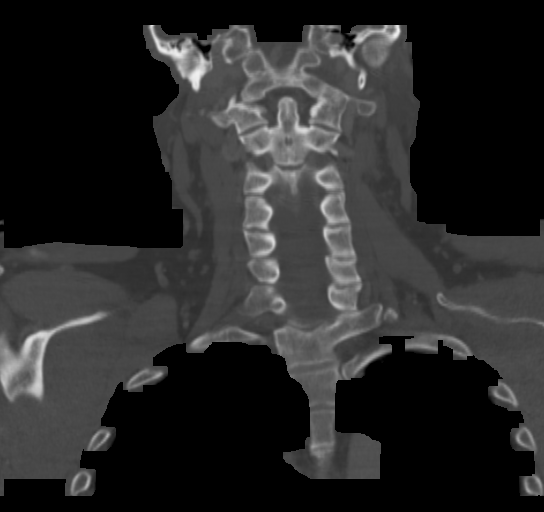

[Series 6: ax oropharynx · axial · 0.48mm/px · z∈[-328,-174]mm · 4 of 131 slices shown, 5 images]
[im 27/131  soft-tissue]
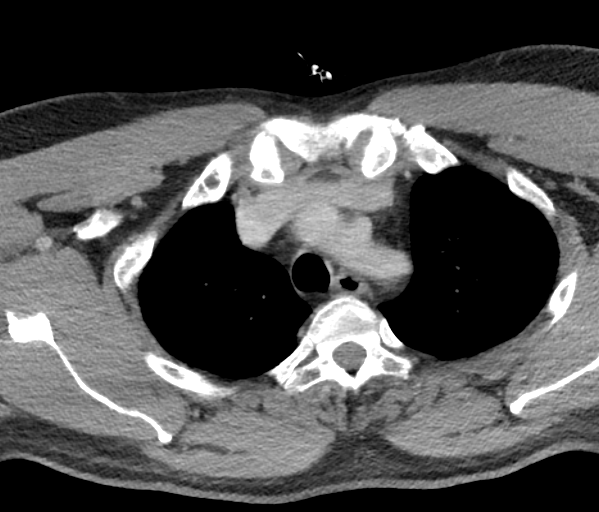
[im 27/131  bone]
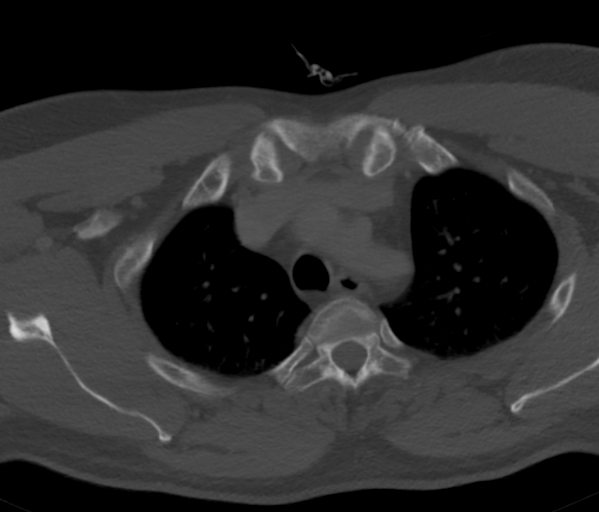
[im 53/131  bone]
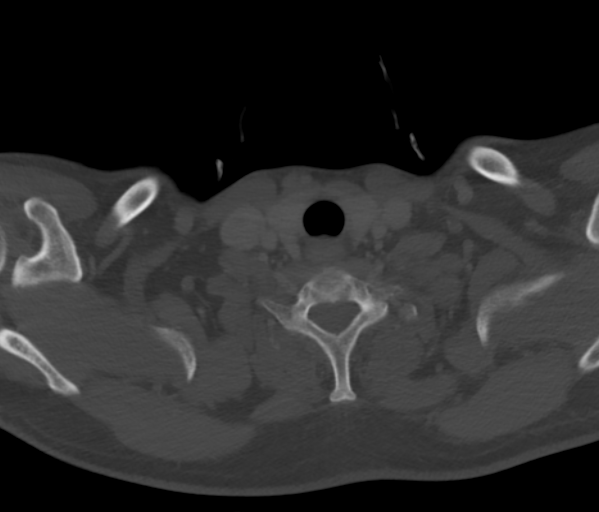
[im 79/131  bone]
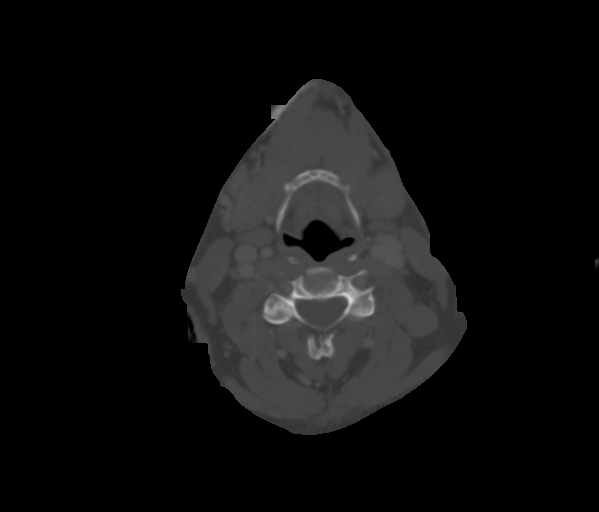
[im 105/131  bone]
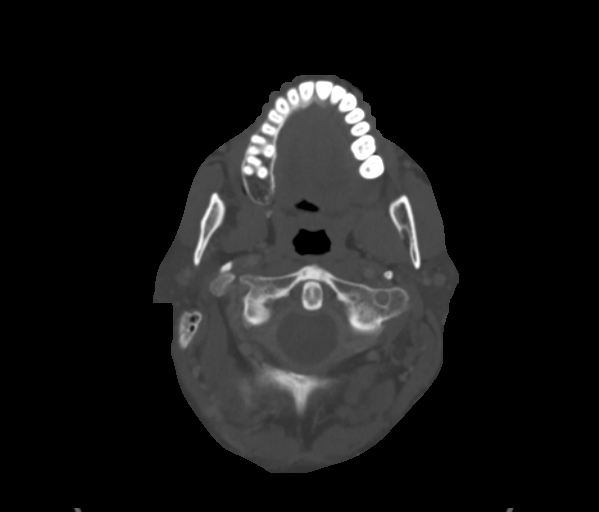

[15 of 33 positions shown; findings below may reference images not displayed]

FINDINGS: Pharynx and larynx: No visible mucosal or submucosal lesion.

Salivary glands: Submandibular glands are normal. In the inferior
aspect of the superficial lobe of the parotid on the left,
underlying the skin marker, there are either 2 adjacent
abnormalities or a snowman shaped abnormality. One component
measures 7 mm in diameter in the adjacent component measures 10 mm
in diameter. The most likely diagnosis is lymph nodes, though
Warthin tumors could have this exact appearance. There is a similar
but smaller rounded density in the inferior aspect of the
superficial lobe of the parotid on the right measuring 6 mm in
diameter. Again the differential diagnosis is lymph node versus
Warthin tumor. Multiplicity argues against other lesions, but any
benign or malignant parotid neoplasm is theoretically possible.

Thyroid: Normal

Lymph nodes: Normal level 2, level 3 and level 4 lymph nodes. No
enlarged or low-density nodes.

Vascular: Arterial and venous structures are normal.

Limited intracranial: Normal

Visualized orbits: Limited visualization normal

Mastoids and visualized paranasal sinuses: Small amount of fluid in
the right maxillary sinus. Others clear.

Skeleton: Normal

Upper chest: Clear
IMPRESSION: The area of concern represents an abnormality in the inferior aspect
of the superficial lobe of the parotid on the left. There are either
2 adjacent masses or a snowman shaped mass, 1 component measuring 7
mm in the other component measuring 10 mm. There is a smaller but
similar finding on the right in the same location, measuring 6 mm.
The most likely diagnosis is intra parotid lymph nodes, but multiple
masses cannot be ruled out. The most common etiology for multiple
parotid masses would be Warthin tumors.

## 2023-01-03 ENCOUNTER — Ambulatory Visit (INDEPENDENT_AMBULATORY_CARE_PROVIDER_SITE_OTHER): Payer: 59

## 2023-01-03 ENCOUNTER — Ambulatory Visit
Admission: EM | Admit: 2023-01-03 | Discharge: 2023-01-03 | Disposition: A | Discharge status: Home / Self Care | Payer: 59 | Source: Home / Self Care

## 2023-01-03 DIAGNOSIS — J189 Pneumonia, unspecified organism: Secondary | ICD-10-CM | POA: Diagnosis not present

## 2023-01-03 DIAGNOSIS — R0602 Shortness of breath: Secondary | ICD-10-CM

## 2023-01-03 MED ORDER — ALBUTEROL SULFATE (2.5 MG/3ML) 0.083% IN NEBU: 2.5000 mg | INHALATION_SOLUTION | Freq: Four times a day (QID) | RESPIRATORY_TRACT | 0 refills | Status: DC | PRN

## 2023-01-03 MED ORDER — DOXYCYCLINE HYCLATE 100 MG PO CAPS: 100.0000 mg | ORAL_CAPSULE | Freq: Two times a day (BID) | ORAL | 0 refills | Status: AC

## 2023-01-03 MED ORDER — ALBUTEROL SULFATE (2.5 MG/3ML) 0.083% IN NEBU
2.5000 mg | INHALATION_SOLUTION | Freq: Once | RESPIRATORY_TRACT | Status: AC
  Administered 2023-01-03: 2.5 mg via RESPIRATORY_TRACT

## 2023-01-03 NOTE — ED Triage Notes (Signed)
Pt is with his wife  PT c/o "feeling bad' around 12/17/22. Pt has had continued cough x1week and is now having pain along his mid chest from the coughing.   Pt states that when he is outside he has pressure on his chest and SOB.  PT had a virtual appointment this morning but was told that his appointment needed to be in person.   Pt took a home covid test and it was positive and was treated with azithromycin, prednisone, ivermectin compound   Pt is worried about pneumonia.

## 2023-01-03 NOTE — Discharge Instructions (Addendum)
Start doxycycline 100 mg twice daily for 14 days.  May use albuterol neb every 6 hours needed for shortness of breath. Stay out of work until 01/07/23. Follow up if worsening or fail to improve

## 2023-01-03 NOTE — ED Provider Notes (Signed)
[facility]    CSN: [id number] Arrival date & time: 01/03/23  1505      History   Chief Complaint Chief Complaint  Patient presents with   Shortness of Breath   Cough   Fatigue         HPI Matthew Levy is a 47 y.o. male.   47 year old male who presents to urgent care with worsening cough, fatigue, shortness of breath and night sweats.  He reports that he and his wife tested themselves at home for COVID about 2 weeks ago and were both positive.  He began to develop a cough and was seen at a clinic and prescribed azithromycin and prednisone.  This only helped slightly with his symptoms but have worsened now.  He reports that at work he is so tired that he is having to lay down on the cold floor at work to take a nap.  He reports that the heat makes it much worse and he works outside a lot.  He is concerned that he may have pneumonia as he is having shortness of breath.  He has subjective fevers and chills.  He is having musculoskeletal chest pain from excessive coughing.  He denies abdominal pain diarrhea, nausea, vomiting or other constitutional symptoms.   Shortness of Breath Associated symptoms: cough, fever and wheezing   Associated symptoms: no abdominal pain, no chest pain, no ear pain, no rash, no sore throat and no vomiting   Cough Associated symptoms: chills, fever, shortness of breath and wheezing   Associated symptoms: no chest pain, no ear pain, no rash and no sore throat     Past Medical History:  Diagnosis Date   Complication of anesthesia    Pt was told he was "restless" and "hard to keep asleep" during procedure   Sinus complaint     There are no problems to display for this patient.   Past Surgical History:  Procedure Laterality Date   COLONOSCOPY     IMAGE GUIDED SINUS SURGERY N/A 05/09/2015   Procedure: IMAGE GUIDED SINUS SURGERY;  Surgeon: Bud Face, MD;  Location: [facility];  Service: ENT;  Laterality: N/A;  GAVE  DISK TO [facility] 11/10   MAXILLARY ANTROSTOMY Right 05/09/2015   Procedure: MAXILLARY ANTROSTOMY;  Surgeon: Bud Face, MD;  Location: [facility];  Service: ENT;  Laterality: Right;  NO SPECIMENS SENT PER MD       Home Medications    Prior to Admission medications   Medication Sig Start Date End Date Taking? Authorizing Provider  HYDROcodone-acetaminophen (NORCO) 5-325 MG tablet Take 1 tablet by mouth every 4 (four) hours as needed for moderate pain. 05/09/15  Yes Vaught, Roney Mans, MD  ondansetron (ZOFRAN ODT) 8 MG disintegrating tablet Take 1 tablet (8 mg total) by mouth 2 (two) times daily. 07/16/15  Yes Lutricia Feil, PA-C  promethazine (PHENERGAN) 12.5 MG tablet Take 1 tablet (12.5 mg total) by mouth every 6 (six) hours as needed for nausea or vomiting. 05/09/15  Yes Vaught, Roney Mans, MD  sulfamethoxazole-trimethoprim (BACTRIM DS,SEPTRA DS) 800-160 MG tablet Take 1 tablet by mouth 2 (two) times daily. 05/09/15  Yes Vaught, Roney Mans, MD    Family History Family History  Problem Relation Age of Onset   Clotting disorder Mother     Social History Social History   Tobacco Use   Smoking status: Never  Vaping Use   Vaping status: Never Used  Substance Use Topics   Alcohol use: No   Drug use: Never  Allergies   Pork-derived products   Review of Systems Review of Systems  Constitutional:  Positive for chills and fever.  HENT:  Negative for ear pain and sore throat.   Eyes:  Negative for pain and visual disturbance.  Respiratory:  Positive for cough, chest tightness, shortness of breath and wheezing.   Cardiovascular:  Negative for chest pain and palpitations.  Gastrointestinal:  Negative for abdominal pain, diarrhea, nausea and vomiting.  Genitourinary:  Negative for dysuria and hematuria.  Musculoskeletal:  Negative for arthralgias and back pain.  Skin:  Negative for color change and rash.  Neurological:  Negative for seizures and syncope.  All  other systems reviewed and are negative.    Physical Exam Triage Vital Signs ED Triage Vitals  Encounter Vitals Group     BP 01/03/23 1540 (!) 144/82     Systolic BP Percentile --      Diastolic BP Percentile --      Pulse Rate 01/03/23 1540 70     Resp --      Temp 01/03/23 1540 98.4 F (36.9 C)     Temp Source 01/03/23 1540 Oral     SpO2 01/03/23 1540 98 %     Weight 01/03/23 1538 235 lb (106.6 kg)     Height 01/03/23 1538 6' (1.829 m)     Head Circumference --      Peak Flow --      Pain Score 01/03/23 1535 8     Pain Loc --      Pain Education --      Exclude from Growth Chart --    No data found.  Updated Vital Signs BP (!) 144/82 (BP Location: Left Arm)   Pulse 70   Temp 98.4 F (36.9 C) (Oral)   Ht 6' (1.829 m)   Wt 235 lb (106.6 kg)   SpO2 98%   BMI 31.87 kg/m   Visual Acuity Right Eye Distance:   Left Eye Distance:   Bilateral Distance:    Right Eye Near:   Left Eye Near:    Bilateral Near:     Physical Exam Vitals and nursing note reviewed.  Constitutional:      General: He is not in acute distress.    Appearance: He is well-developed.  HENT:     Head: Normocephalic and atraumatic.  Eyes:     Conjunctiva/sclera: Conjunctivae normal.  Cardiovascular:     Rate and Rhythm: Normal rate and regular rhythm.     Heart sounds: No murmur heard. Pulmonary:     Effort: Pulmonary effort is normal. No respiratory distress.     Breath sounds: Examination of the right-upper field reveals decreased breath sounds and rales. Examination of the right-middle field reveals decreased breath sounds. Decreased breath sounds and rales present. No wheezing.  Abdominal:     Palpations: Abdomen is soft.     Tenderness: There is no abdominal tenderness.  Musculoskeletal:        General: No swelling. Normal range of motion.     Cervical back: Neck supple.  Skin:    General: Skin is warm and dry.     Capillary Refill: Capillary refill takes less than 2 seconds.   Neurological:     Mental Status: He is alert.  Psychiatric:        Mood and Affect: Mood normal.      UC Treatments / Results  Labs (all labs ordered are listed, but only abnormal results are displayed) Labs Reviewed - No  data to display  EKG   Radiology No results found.  Procedures Procedures (including critical care time)  Medications Ordered in UC Medications - No data to display  Initial Impression / Assessment and Plan / UC Course  I have reviewed the triage vital signs and the nursing notes.  Pertinent labs & imaging results that were available during my care of the patient were reviewed by me and considered in my medical decision making (see chart for details).     Right lung pneumonia: The patient does have early signs of pneumonia on chest x-ray.  He has already been on azithromycin without relief.  He is not presenting with signs of sepsis.  Will start doxycycline twice daily for 14 days.  The nebulizer treatment he received here today helped significantly and we have sent him 5 days of nebulizing treatments with albuterol.  They have a nebulizer at home.  Advised patient that he needs to take some time off from work in order to allow himself to heal and get stronger.  If there is no improvement or worsening of symptoms he should follow-up. Final Clinical Impressions(s) / UC Diagnoses   Final diagnoses:  None   Discharge Instructions   None    ED Prescriptions   None    PDMP not reviewed this encounter.   Landis Martins, [place] 01/03/23 1654

## 2023-05-08 ENCOUNTER — Encounter: Payer: Self-pay | Admitting: Family Medicine

## 2023-05-08 ENCOUNTER — Ambulatory Visit (INDEPENDENT_AMBULATORY_CARE_PROVIDER_SITE_OTHER): Payer: 59 | Admitting: Family Medicine

## 2023-05-08 VITALS — BP 138/82 | HR 78 | Ht 72.0 in | Wt 239.0 lb

## 2023-05-08 DIAGNOSIS — M7541 Impingement syndrome of right shoulder: Secondary | ICD-10-CM | POA: Insufficient documentation

## 2023-05-08 MED ORDER — MELOXICAM 15 MG PO TABS: 15.0000 mg | ORAL_TABLET | Freq: Every day | ORAL | 0 refills | Status: DC

## 2023-05-08 MED ORDER — CYCLOBENZAPRINE HCL 10 MG PO TABS: 10.0000 mg | ORAL_TABLET | Freq: Every evening | ORAL | 0 refills | Status: DC | PRN

## 2023-05-08 NOTE — Progress Notes (Signed)
Primary Care / Sports Medicine Office Visit  Patient Information:  Patient ID: Matthew Levy, male DOB: 06-Oct-1975 Age: 47 y.o. MRN: [id number]   Matthew Levy is a pleasant 47 y.o. male presenting with the following:  Chief Complaint  Patient presents with   Shoulder Pain    Patient presents today for right shoulder pain x 3 months. No known injury. Patient has difficulty with lifting and ROM. Patient has taken ibuprofen for a while but now the ibuprofen is not working.     Vitals:   05/08/23 1336  BP: 138/82  Pulse: 78  SpO2: 96%   Vitals:   05/08/23 1336  Weight: 239 lb (108.4 kg)  Height: 6' (1.829 m)   Body mass index is 32.41 kg/m.  No results found.   Independent interpretation of notes and tests performed by another provider:   None  Procedures performed:   None  Pertinent History, Exam, Impression, and Recommendations:   Problem List Items Addressed This Visit       Musculoskeletal and Integument   Rotator cuff impingement syndrome of right shoulder - Primary   History of Present Illness The patient, a right-handed power line repairman, presents with a three to four-month history of atraumatic right shoulder pain. The pain is aggravated by certain movements, particularly when reaching out or lifting heavy objects, such as cables weighing around 60-70 pounds. The patient reports that the pain is not severe enough to prevent him from working, but it has caused him to favor his left arm. He also notes a decrease in range of motion and strength in the affected shoulder. The patient has been self-medicating with ibuprofen 800 mg, usually at night. Initially, this provided some relief, but the effectiveness has diminished over time. The patient also reports that the pain disrupts his sleep, often waking him up at night.  Physical Exam MUSCULOSKELETAL: Right shoulder pain with forward flexion at 90 degrees, limited by pain. Able to actively forward flex  beyond 90 degrees with worsening pain. Full symmetric abduction with pain on right. Full symmetric external rotation with pain on right. Mild limitations in maximal extension and internal rotation of right shoulder compared to left. Rotator cuff strength 5/5 with mild pain on external rotation. Isolated supraspinatus test 5/5 strength, maximally painful.  Mildly tender subacromial, non tender bicipital groove and upper trapezius. Positive Neer's test. Positive Hawkin's test. Equivocal Speed's test. Equivocal Yergason's test.  Assessment & Plan Right shoulder rotator cuff impingement syndrome Pain for 3-4 months with decreased range of motion and weakness. Diagnosis of rotator cuff impingement syndrome and secondary biceps tendonitis.  Primary focality to the supraspinatus, cannot exclude element of tearing, conveyed to patient/family -Start Meloxicam 15mg  daily for 2 weeks, then as needed. -Can use of Cyclobenzaprine (Flexeril) as needed for muscle relaxation. -Initiate home exercises for shoulder rehabilitation. -Return for follow-up in 4 weeks to assess progress and discuss further treatment options if necessary. -Recalcitrant symptoms can be addressed with consideration of subacromial corticosteroid injection, modification of oral pharmacotherapy, advanced imaging, formal PT        Orders & Medications Medications:  Meds ordered this encounter  Medications   meloxicam (MOBIC) 15 MG tablet    Sig: Take 1 tablet (15 mg total) by mouth daily. X 2 weeks then daily as-needed (take with food)    Dispense:  30 tablet    Refill:  0   cyclobenzaprine (FLEXERIL) 10 MG tablet    Sig: Take 1 tablet (10 mg total) by  mouth at bedtime as needed for muscle spasms.    Dispense:  30 tablet    Refill:  0   No orders of the defined types were placed in this encounter.    Return in about 4 weeks (around 06/05/2023) for f/i shoulder.     Jerrol Banana, MD, [facility]   Primary Care Sports  Medicine Primary Care and Sports Medicine at [facility]

## 2023-05-08 NOTE — Patient Instructions (Addendum)
-   Take Meloxicam 15 mg once daily for 2 weeks, then use as needed. - Use Cyclobenzaprine (Flexeril) as needed for muscle relaxation. - Start home exercises for shoulder rehabilitation as discussed mid-week next week. - Return for a follow-up appointment in 4 weeks.

## 2023-05-08 NOTE — Assessment & Plan Note (Signed)
History of Present Illness The patient, a right-handed power line repairman, presents with a three to four-month history of atraumatic right shoulder pain. The pain is aggravated by certain movements, particularly when reaching out or lifting heavy objects, such as cables weighing around 60-70 pounds. The patient reports that the pain is not severe enough to prevent him from working, but it has caused him to favor his left arm. He also notes a decrease in range of motion and strength in the affected shoulder. The patient has been self-medicating with ibuprofen 800 mg, usually at night. Initially, this provided some relief, but the effectiveness has diminished over time. The patient also reports that the pain disrupts his sleep, often waking him up at night.  Physical Exam MUSCULOSKELETAL: Right shoulder pain with forward flexion at 90 degrees, limited by pain. Able to actively forward flex beyond 90 degrees with worsening pain. Full symmetric abduction with pain on right. Full symmetric external rotation with pain on right. Mild limitations in maximal extension and internal rotation of right shoulder compared to left. Rotator cuff strength 5/5 with mild pain on external rotation. Isolated supraspinatus test 5/5 strength, maximally painful.  Mildly tender subacromial, non tender bicipital groove and upper trapezius. Positive Neer's test. Positive Hawkin's test. Equivocal Speed's test. Equivocal Yergason's test.  Assessment & Plan Right shoulder rotator cuff impingement syndrome Pain for 3-4 months with decreased range of motion and weakness. Diagnosis of rotator cuff impingement syndrome and secondary biceps tendonitis.  Primary focality to the supraspinatus, cannot exclude element of tearing, conveyed to patient/family -Start Meloxicam 15mg  daily for 2 weeks, then as needed. -Can use of Cyclobenzaprine (Flexeril) as needed for muscle relaxation. -Initiate home exercises for shoulder  rehabilitation. -Return for follow-up in 4 weeks to assess progress and discuss further treatment options if necessary. -Recalcitrant symptoms can be addressed with consideration of subacromial corticosteroid injection, modification of oral pharmacotherapy, advanced imaging, formal PT

## 2023-06-05 ENCOUNTER — Ambulatory Visit (INDEPENDENT_AMBULATORY_CARE_PROVIDER_SITE_OTHER): Payer: 59 | Admitting: Family Medicine

## 2023-06-05 ENCOUNTER — Other Ambulatory Visit: Payer: Self-pay | Admitting: Family Medicine

## 2023-06-05 VITALS — BP 150/110 | HR 82 | Ht 72.0 in | Wt 239.4 lb

## 2023-06-05 DIAGNOSIS — R030 Elevated blood-pressure reading, without diagnosis of hypertension: Secondary | ICD-10-CM

## 2023-06-05 DIAGNOSIS — M7541 Impingement syndrome of right shoulder: Secondary | ICD-10-CM

## 2023-06-05 MED ORDER — MELOXICAM 7.5 MG PO TABS: 7.5000 mg | ORAL_TABLET | Freq: Every day | ORAL | 0 refills | Status: DC | PRN

## 2023-06-05 NOTE — Telephone Encounter (Signed)
Requested medication (s) are due for refill today: yes  Requested medication (s) are on the active medication list: yes  Last refill:  05/08/23  Future visit scheduled: yes  Notes to clinic:  Unable to refill per protocol due to failed labs, no updated results.      Requested Prescriptions  Pending Prescriptions Disp Refills   meloxicam (MOBIC) 15 MG tablet [Pharmacy Med Name: MELOXICAM 15 MG TABLET] 30 tablet 0    Sig: Take 1 tablet (15 mg total) by mouth daily. X 2 weeks then daily as-needed (take with food)     Analgesics:  COX2 Inhibitors Failed - 06/05/2023  9:00 AM      Failed - Manual Review: Labs are only required if the patient has taken medication for more than 8 weeks.      Failed - HGB in normal range and within 360 days    Hemoglobin  Date Value Ref Range Status  07/16/2015 16.2 13.0 - 18.0 g/dL Final         Failed - Cr in normal range and within 360 days    Creatinine, Ser  Date Value Ref Range Status  07/16/2015 0.85 0.61 - 1.24 mg/dL Final         Failed - HCT in normal range and within 360 days    HCT  Date Value Ref Range Status  07/16/2015 47.1 40.0 - 52.0 % Final         Failed - AST in normal range and within 360 days    AST  Date Value Ref Range Status  07/16/2015 30 15 - 41 U/L Final         Failed - ALT in normal range and within 360 days    ALT  Date Value Ref Range Status  07/16/2015 39 17 - 63 U/L Final         Failed - eGFR is 30 or above and within 360 days    GFR calc Af Amer  Date Value Ref Range Status  07/16/2015 >60 >60 mL/min Final    Comment:    (NOTE) The eGFR has been calculated using the CKD EPI equation. This calculation has not been validated in all clinical situations. eGFR's persistently <60 mL/min signify possible Chronic Kidney Disease.    GFR calc non Af Amer  Date Value Ref Range Status  07/16/2015 >60 >60 mL/min Final         Failed - Valid encounter within last 12 months    Recent Outpatient Visits            4 weeks ago Rotator cuff impingement syndrome of right shoulder   [place] [facility] at [facility], Ocie Bob, MD       Future Appointments             Today Ashley Royalty Ocie Bob, MD [facility] at [facility], [facility]            Passed - Patient is not pregnant

## 2023-06-10 ENCOUNTER — Encounter: Payer: Self-pay | Admitting: Family Medicine

## 2023-06-10 DIAGNOSIS — R030 Elevated blood-pressure reading, without diagnosis of hypertension: Secondary | ICD-10-CM | POA: Insufficient documentation

## 2023-06-10 DIAGNOSIS — I10 Essential (primary) hypertension: Secondary | ICD-10-CM | POA: Insufficient documentation

## 2023-06-10 NOTE — Progress Notes (Signed)
     Primary Care / Sports Medicine Office Visit  Patient Information:  Patient ID: Matthew Levy, male DOB: 1976/04/28 Age: 48 y.o. MRN: [id number]   Matthew Levy is a pleasant 48 y.o. male presenting with the following:  Chief Complaint  Patient presents with   Shoulder Pain    F/u on right shoulder pain. Patient was to start Meloxicam 15mg  daily for 2 weeks, then as needed.  Patient has not started home exericise program. His shoulder is feeling somewhat better.     Vitals:   06/05/23 1413 06/05/23 1418  BP: (!) 150/90 (!) 150/110  Pulse: 82   SpO2: 99%    Vitals:   06/05/23 1413  Weight: 239 lb 6.4 oz (108.6 kg)  Height: 6' (1.829 m)   Body mass index is 32.47 kg/m.  No results found.   Independent interpretation of notes and tests performed by another provider:   None  Procedures performed:   None  Pertinent History, Exam, Impression, and Recommendations:   Problem List Items Addressed This Visit     Blood pressure elevated without history of HTN   Assessment and Plan Hypertension Noted elevated blood pressure of 150/110. Patient has a history of hypertension. Discussed the need for primary care follow-up. -Recommend establishing care with a primary care provider for hypertension management.      Rotator cuff impingement syndrome of right shoulder - Primary   History of Present Illness The patient, with a history of shoulder pain, presents for a follow-up visit. He reports a significant improvement in his shoulder pain since the last visit, estimating a 50% reduction in pain. The patient notes that the pain is most noticeable when reaching into deep holes at work or lifting heavy objects. The pain does not persist after these activities and does not disturb his sleep. The patient has been taking anti-inflammatory medication, which he believes has contributed to the improvement in his symptoms. He did not complete the prescribed physical therapy  exercises due to forgetting. The patient's blood pressure was found to be high during the visit, which is a new finding.  Physical Exam VITALS: BP- 150/110, BP- 138/82 MUSCULOSKELETAL: Isolated supraspinatus testing 5/5 strength, painless. External rotation 5/5 strength with minimal pain. Internal rotation 5/5 strength, painless. Negative Neer's test. Equivocal Hawkins test. Negative Speed's test. Negative Erikson's test.  Right shoulder impingement syndrome Improvement noted with medication and physical therapy. Pain is now 50% better than the previous visit. No pain at night or with heavy lifting. Pain is present with certain movements and positions. -Continue home exercises for rotator cuff strengthening. -Discontinue daily use of anti-inflammatory medication. Use remaining medication as needed for pain. -Consider use of over-the-counter Voltaren gel and Tylenol for pain management. -Check-in towards the end of February if improvement plateaus or symptoms worsen. Imaging and other interventions can be considered at that time.        Orders & Medications Medications:  Meds ordered this encounter  Medications   meloxicam (MOBIC) 7.5 MG tablet    Sig: Take 1-2 tablets (7.5-15 mg total) by mouth daily as needed for pain.    Dispense:  60 tablet    Refill:  0   No orders of the defined types were placed in this encounter.    No follow-ups on file.     Matthew Banana, MD, [facility]   Primary Care Sports Medicine Primary Care and Sports Medicine at [facility]

## 2023-06-10 NOTE — Assessment & Plan Note (Signed)
Assessment and Plan Hypertension Noted elevated blood pressure of 150/110. Patient has a history of hypertension. Discussed the need for primary care follow-up. -Recommend establishing care with a primary care provider for hypertension management.

## 2023-06-10 NOTE — Assessment & Plan Note (Signed)
History of Present Illness The patient, with a history of shoulder pain, presents for a follow-up visit. He reports a significant improvement in his shoulder pain since the last visit, estimating a 50% reduction in pain. The patient notes that the pain is most noticeable when reaching into deep holes at work or lifting heavy objects. The pain does not persist after these activities and does not disturb his sleep. The patient has been taking anti-inflammatory medication, which he believes has contributed to the improvement in his symptoms. He did not complete the prescribed physical therapy exercises due to forgetting. The patient's blood pressure was found to be high during the visit, which is a new finding.  Physical Exam VITALS: BP- 150/110, BP- 138/82 MUSCULOSKELETAL: Isolated supraspinatus testing 5/5 strength, painless. External rotation 5/5 strength with minimal pain. Internal rotation 5/5 strength, painless. Negative Neer's test. Equivocal Hawkins test. Negative Speed's test. Negative Erikson's test.  Right shoulder impingement syndrome Improvement noted with medication and physical therapy. Pain is now 50% better than the previous visit. No pain at night or with heavy lifting. Pain is present with certain movements and positions. -Continue home exercises for rotator cuff strengthening. -Discontinue daily use of anti-inflammatory medication. Use remaining medication as needed for pain. -Consider use of over-the-counter Voltaren gel and Tylenol for pain management. -Check-in towards the end of February if improvement plateaus or symptoms worsen. Imaging and other interventions can be considered at that time.

## 2023-06-10 NOTE — Patient Instructions (Signed)
Visit Summary:  - Your shoulder pain has improved by 50%, especially when reaching or lifting. - Your blood pressure was high during the visit.  Plan:  1. **Hypertension:**    - Find a primary care provider to help manage your blood pressure.  2. **Shoulder Pain:**    - Continue home exercises for rotator cuff strengthening.    - Use Voltaren gel and Tylenol for pain relief.    - Stop daily anti-inflammatory medication; use only as needed.    - If pain does not improve or gets worse, check in towards the end of February for further evaluation.  Instructions:  - See a primary care provider for your blood pressure. - If shoulder pain worsens or doesn't improve, follow up at the end of February.

## 2023-07-26 ENCOUNTER — Other Ambulatory Visit: Payer: Self-pay | Admitting: Family Medicine

## 2023-07-28 NOTE — Telephone Encounter (Signed)
 Please review.  KP

## 2023-07-28 NOTE — Telephone Encounter (Signed)
 Requested medication (s) are due for refill today: yes  Requested medication (s) are on the active medication list: yes  Last refill:  06/05/23  Future visit scheduled: no  Notes to clinic:  Unable to refill per protocol due to failed labs, no updated results.       Requested Prescriptions  Pending Prescriptions Disp Refills   meloxicam (MOBIC) 7.5 MG tablet [Pharmacy Med Name: MELOXICAM 7.5 MG TABLET] 60 tablet 0    Sig: TAKE 1 TO 2 TABLETS BY MOUTH EVERY DAY AS NEEDED FOR PAIN     Analgesics:  COX2 Inhibitors Failed - 07/28/2023  8:11 AM      Failed - Manual Review: Labs are only required if the patient has taken medication for more than 8 weeks.      Failed - HGB in normal range and within 360 days    Hemoglobin  Date Value Ref Range Status  07/16/2015 16.2 13.0 - 18.0 g/dL Final         Failed - Cr in normal range and within 360 days    Creatinine, Ser  Date Value Ref Range Status  07/16/2015 0.85 0.61 - 1.24 mg/dL Final         Failed - HCT in normal range and within 360 days    HCT  Date Value Ref Range Status  07/16/2015 47.1 40.0 - 52.0 % Final         Failed - AST in normal range and within 360 days    AST  Date Value Ref Range Status  07/16/2015 30 15 - 41 U/L Final         Failed - ALT in normal range and within 360 days    ALT  Date Value Ref Range Status  07/16/2015 39 17 - 63 U/L Final         Failed - eGFR is 30 or above and within 360 days    GFR calc Af Amer  Date Value Ref Range Status  07/16/2015 >60 >60 mL/min Final    Comment:    (NOTE) The eGFR has been calculated using the CKD EPI equation. This calculation has not been validated in all clinical situations. eGFR's persistently <60 mL/min signify possible Chronic Kidney Disease.    GFR calc non Af Amer  Date Value Ref Range Status  07/16/2015 >60 >60 mL/min Final         Passed - Patient is not pregnant      Passed - Valid encounter within last 12 months    Recent Outpatient  Visits           1 month ago Rotator cuff impingement syndrome of right shoulder   [place] [facility] at [facility], Ocie Bob, MD   2 months ago Rotator cuff impingement syndrome of right shoulder   [facility] at [facility], Ocie Bob, MD

## 2024-01-07 ENCOUNTER — Ambulatory Visit: Payer: Self-pay

## 2024-01-07 NOTE — Telephone Encounter (Signed)
 FYI Only or Action Required?: FYI only for provider.  Patient was last seen in primary care on 06/05/2023 by Alvia Selinda PARAS, MD.  Called Nurse Triage reporting Urinary Frequency.  Symptoms began a week ago.  Interventions attempted: Nothing.  Symptoms are: unchanged.  Triage Disposition: See Physician Within 24 Hours  Patient/caregiver understands and will follow disposition?: Yes   Copied from CRM #[id number]. Topic: Clinical - Medical Advice >> Jan 07, 2024  9:28 AM Amy B wrote: Reason for CRM: Patient is experiencing severe urinary urgency and frequent voiding.  Would like to be seen tomorrow if possible. Reason for Disposition  Urinating more frequently than usual (i.e., frequency) OR new-onset of the feeling of an urgent need to urinate (i.e., urgency)  Answer Assessment - Initial Assessment Questions 1. SYMPTOM: What's the main symptom you're concerned about? (e.g., frequency, incontinence)     Urgency and frequency 2. ONSET: When did the    start?     A week ago  3. PAIN: Is there any pain? If Yes, ask: How bad is it? (Scale: 1-10; mild, moderate, severe)     no 4. CAUSE: What do you think is causing the symptoms?     uti 5. OTHER SYMPTOMS: Do you have any other symptoms? (e.g., blood in urine, fever, flank pain, pain with urination)     No- pt states over the last week he has been having increase nocturia as well.  Protocols used: Urinary Symptoms-A-AH

## 2024-01-07 NOTE — Telephone Encounter (Signed)
 Noted  Pt has a appt.  KP

## 2024-01-08 ENCOUNTER — Ambulatory Visit (INDEPENDENT_AMBULATORY_CARE_PROVIDER_SITE_OTHER): Admitting: Student

## 2024-01-08 ENCOUNTER — Encounter: Payer: Self-pay | Admitting: Student

## 2024-01-08 VITALS — BP 164/90 | HR 72 | Ht 72.0 in | Wt 221.5 lb

## 2024-01-08 DIAGNOSIS — N401 Enlarged prostate with lower urinary tract symptoms: Secondary | ICD-10-CM | POA: Diagnosis not present

## 2024-01-08 DIAGNOSIS — R351 Nocturia: Secondary | ICD-10-CM | POA: Diagnosis not present

## 2024-01-08 DIAGNOSIS — Z860100 Personal history of colon polyps, unspecified: Secondary | ICD-10-CM | POA: Diagnosis not present

## 2024-01-08 DIAGNOSIS — M7541 Impingement syndrome of right shoulder: Secondary | ICD-10-CM

## 2024-01-08 DIAGNOSIS — I10 Essential (primary) hypertension: Secondary | ICD-10-CM

## 2024-01-08 LAB — POCT URINALYSIS DIPSTICK
Bilirubin, UA: NEGATIVE
Blood, UA: NEGATIVE
Glucose, UA: NEGATIVE
Ketones, UA: NEGATIVE
Leukocytes, UA: NEGATIVE
Nitrite, UA: NEGATIVE
Protein, UA: NEGATIVE
Spec Grav, UA: 1.02 (ref 1.010–1.025)
Urobilinogen, UA: 0.2 U/dL — AB
pH, UA: 6 (ref 5.0–8.0)

## 2024-01-08 MED ORDER — TAMSULOSIN HCL 0.4 MG PO CAPS: 0.4000 mg | ORAL_CAPSULE | Freq: Every day | ORAL | 1 refills | Status: DC

## 2024-01-08 NOTE — Progress Notes (Signed)
 New Patient Office Visit  Subjective    Patient ID: Matthew Levy, male    DOB: Jan 30, 1976  Age: 48 y.o. MRN: [id number]  CC:  Chief Complaint  Patient presents with   Establish Care    Patient is here to establish care    Urinary Frequency    Patient reports waking up 2 - 3 times a night to use the restroom, can not hold his bladder for too long without rushing to the restroom     HPI Matthew Levy 48 y.o. male who presents to establish care. Last saw PCP at [facility] about 3 years ago.   Urinary symptoms for the past 2 weeks. Reports difficulty initiating and weak stream that has been gradually worsening. Now needing to wake up twice a night to urinate. Often feels he is unable to void much/incompletely emptying his bladder. Reports history of hematuria and reports negative imaging and cystoscopy. Does not have gross hematuria now. Drinks ice tea daily in the morning and after work.    Outpatient Encounter Medications as of 01/08/2024  Medication Sig   tamsulosin  (FLOMAX ) 0.4 MG CAPS capsule Take 1 capsule (0.4 mg total) by mouth daily.   [DISCONTINUED] meloxicam  (MOBIC ) 7.5 MG tablet TAKE 1 TO 2 TABLETS BY MOUTH EVERY DAY AS NEEDED FOR PAIN (Patient not taking: Reported on 01/08/2024)   No facility-administered encounter medications on file as of 01/08/2024.    Past Medical History:  Diagnosis Date   Complication of anesthesia    Pt was told he was restless and hard to keep asleep during procedure   Sinus complaint     Past Surgical History:  Procedure Laterality Date   COLONOSCOPY     IMAGE GUIDED SINUS SURGERY N/A 05/09/2015   Procedure: IMAGE GUIDED SINUS SURGERY;  Surgeon: Carolee Hunter, MD;  Location: [facility];  Service: ENT;  Laterality: N/A;  GAVE DISK TO [facility] 11/10   MAXILLARY ANTROSTOMY Right 05/09/2015   Procedure: MAXILLARY ANTROSTOMY;  Surgeon: Carolee Hunter, MD;  Location: [facility];  Service: ENT;  Laterality: Right;  NO  SPECIMENS SENT PER MD    Family History  Problem Relation Age of Onset   Hypertension Mother    Clotting disorder Mother    Diabetes Father    Hypertension Father    Congestive Heart Failure Father    Heart disease Paternal Grandfather     Social History   Socioeconomic History   Marital status: Married    Spouse name: Not on file   Number of children: Not on file   Years of education: Not on file   Highest education level: Not on file  Occupational History   Not on file  Tobacco Use   Smoking status: Never   Smokeless tobacco: Not on file  Vaping Use   Vaping status: Never Used  Substance and Sexual Activity   Alcohol use: No   Drug use: Never   Sexual activity: Not on file  Other Topics Concern   Not on file  Social History Narrative   Not on file   Social Drivers of Health   Financial Resource Strain: Not on file  Food Insecurity: Not on file  Transportation Needs: Not on file  Physical Activity: Not on file  Stress: Not on file  Social Connections: Not on file  Intimate Partner Violence: Not on file    ROS Refer to HPI    Objective   BP (!) 164/90   Pulse 72   Ht 6' (  1.829 m)   Wt 221 lb 8 oz (100.5 kg)   SpO2 97%   BMI 30.04 kg/m   Physical Exam Exam conducted with a chaperone present.  Constitutional:      Appearance: Normal appearance.  HENT:     Mouth/Throat:     Mouth: Mucous membranes are moist.     Pharynx: Oropharynx is clear.  Cardiovascular:     Rate and Rhythm: Normal rate and regular rhythm.  Pulmonary:     Effort: Pulmonary effort is normal.     Breath sounds: No rhonchi or rales.  Abdominal:     General: Abdomen is flat. Bowel sounds are normal. There is no distension.     Palpations: Abdomen is soft.     Tenderness: There is no abdominal tenderness.  Genitourinary:    Prostate: Enlarged. Not tender and no nodules present.     Rectum: No mass or tenderness.  Musculoskeletal:        General: Normal range of motion.      Right lower leg: No edema.     Left lower leg: No edema.  Skin:    General: Skin is warm and dry.     Capillary Refill: Capillary refill takes less than 2 seconds.  Neurological:     General: No focal deficit present.     Mental Status: He is alert and oriented to person, place, and time.  Psychiatric:        Mood and Affect: Mood normal.        Behavior: Behavior normal.        01/08/2024    3:42 PM 05/08/2023    1:44 PM  Depression screen PHQ 2/9  Decreased Interest 0 0  Down, Depressed, Hopeless 0 0  PHQ - 2 Score 0 0  Altered sleeping 3 0  Tired, decreased energy 0 0  Change in appetite 0 0  Feeling bad or failure about yourself  0 0  Trouble concentrating 0 0  Moving slowly or fidgety/restless 0 0  Suicidal thoughts 0 0  PHQ-9 Score 3 0  Difficult doing work/chores Not difficult at all       01/08/2024    3:42 PM 05/08/2023    1:45 PM  GAD 7 : Generalized Anxiety Score  Nervous, Anxious, on Edge 0 0  Control/stop worrying 0 0  Worry too much - different things 0 0  Trouble relaxing 0 0  Restless 0 0  Easily annoyed or irritable 0 0  Afraid - awful might happen 0 0  Total GAD 7 Score 0 0  Anxiety Difficulty Not difficult at all Not difficult at all        Assessment & Plan:  Nocturia -     POCT urinalysis dipstick -     Urine Culture -     Urinalysis, Routine w reflex microscopic -     PSA  History of colonic polyps Assessment & Plan: Coloscopy and EGD in 2015 for GI bleeding pathology on 6/19/2015TA of the trasverse and sigmoid colon. 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 He denies melena, hematochezia, constipation, abdominal pain, or diarrhea. He as recommend 3-5 year follow but he never returned. Referral made to GI.   Orders: -     Ambulatory referral to Gastroenterology  Rotator cuff impingement syndrome of right shoulder Assessment & Plan: Pain has resolved from this. No longer need NSAIDs  for pain.   Benign prostatic hyperplasia  with nocturia Assessment & Plan: Increase nocturia in the last 2 weeks with progression of obstructive symptoms. History of hematuria, per chart review CT and cystoscopy negative in 2014 however unable to view studies. Urine dipstick negative today. Enlarge prostate on exam without nodularity or tenderness. Suspect BPH, no family history of prostate cancer. -discussed behavior modification including decrease caffeine intake, timed voiding -start tamsulosin  0.4 mg daily  -UA and culture, PSA   Hypertension, unspecified type Assessment & Plan: BP remains elevated to 164/90 today. He is asymptomatic. History of hypertension not on medication. Was seeing [facility] primary care at the time and worked on weight loss with some improvement but has since regained most of the weight. His wife is concern he has a component of white coat hypertension. On chart review his BP measurements have been consistently elevated and I think he would benefit from antihypertensive therapy.  He would like to avoid mediation.  -Patient will keep BP log at home -Discussed DASH diet, weight loss, and low sodium intake -Follow up in 2 weeks, if home measures are consistent with clinic measure will discuss antihypertensive therapy    Other orders -     Tamsulosin  HCl; Take 1 capsule (0.4 mg total) by mouth daily.  Dispense: 30 capsule; Refill: 1    Return in about 2 weeks (around 01/22/2024) for HTN.   Harlene Saddler, MD

## 2024-01-09 ENCOUNTER — Encounter: Payer: Self-pay | Admitting: Student

## 2024-01-09 LAB — URINALYSIS, ROUTINE W REFLEX MICROSCOPIC
Bilirubin, UA: NEGATIVE
Glucose, UA: NEGATIVE
Ketones, UA: NEGATIVE
Leukocytes,UA: NEGATIVE
Nitrite, UA: NEGATIVE
Protein,UA: NEGATIVE
RBC, UA: NEGATIVE
Specific Gravity, UA: 1.02 (ref 1.005–1.030)
Urobilinogen, Ur: 0.2 mg/dL (ref 0.2–1.0)
pH, UA: 6 (ref 5.0–7.5)

## 2024-01-09 LAB — PSA: Prostate Specific Ag, Serum: 0.7 ng/mL (ref 0.0–4.0)

## 2024-01-11 ENCOUNTER — Ambulatory Visit: Payer: Self-pay | Admitting: Student

## 2024-01-11 DIAGNOSIS — Z860100 Personal history of colon polyps, unspecified: Secondary | ICD-10-CM | POA: Insufficient documentation

## 2024-01-11 DIAGNOSIS — N400 Enlarged prostate without lower urinary tract symptoms: Secondary | ICD-10-CM | POA: Insufficient documentation

## 2024-01-11 LAB — URINE CULTURE

## 2024-01-11 NOTE — Assessment & Plan Note (Addendum)
 Coloscopy and EGD in 2015 for GI bleeding pathology on 6/19/2015TA of the trasverse and sigmoid colon. 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 He denies melena, hematochezia, constipation, abdominal pain, or diarrhea. He as recommend 3-5 year follow but he never returned. Referral made to GI.

## 2024-01-11 NOTE — Assessment & Plan Note (Signed)
 Pain has resolved from this. No longer need NSAIDs for pain.

## 2024-01-11 NOTE — Assessment & Plan Note (Signed)
 BP remains elevated to 164/90 today. He is asymptomatic. History of hypertension not on medication. Was seeing [facility] primary care at the time and worked on weight loss with some improvement but has since regained most of the weight. His wife is concern he has a component of white coat hypertension. On chart review his BP measurements have been consistently elevated and I think he would benefit from antihypertensive therapy.  He would like to avoid mediation.  -Patient will keep BP log at home -Discussed DASH diet, weight loss, and low sodium intake -Follow up in 2 weeks, if home measures are consistent with clinic measure will discuss antihypertensive therapy

## 2024-01-11 NOTE — Assessment & Plan Note (Addendum)
 Increase nocturia in the last 2 weeks with progression of obstructive symptoms. History of hematuria, per chart review CT and cystoscopy negative in 2014 however unable to view studies. Urine dipstick negative today. Enlarge prostate on exam without nodularity or tenderness. Suspect BPH, no family history of prostate cancer. -discussed behavior modification including decrease caffeine intake, timed voiding -start tamsulosin  0.4 mg daily  -UA and culture, PSA

## 2024-01-11 NOTE — Telephone Encounter (Signed)
 Please review patient's message:

## 2024-01-29 ENCOUNTER — Ambulatory Visit: Admitting: Student

## 2024-02-02 ENCOUNTER — Other Ambulatory Visit: Payer: Self-pay | Admitting: Student

## 2024-02-03 NOTE — Telephone Encounter (Signed)
 Requested Prescriptions  Pending Prescriptions Disp Refills   tamsulosin  (FLOMAX ) 0.4 MG CAPS capsule [Pharmacy Med Name: TAMSULOSIN  HCL 0.4 MG CAPSULE] 90 capsule 0    Sig: TAKE 1 CAPSULE BY MOUTH EVERY DAY     Urology: Alpha-Adrenergic Blocker Failed - 02/03/2024  3:19 PM      Failed - Last BP in normal range    BP Readings from Last 1 Encounters:  01/08/24 (!) 164/90         Passed - PSA in normal range and within 360 days    Prostate Specific Ag, Serum  Date Value Ref Range Status  01/08/2024 0.7 0.0 - 4.0 ng/mL Final    Comment:    Roche ECLIA methodology. According to the American Urological Association, Serum PSA should decrease and remain at undetectable levels after radical prostatectomy. The AUA defines biochemical recurrence as an initial PSA value 0.2 ng/mL or greater followed by a subsequent confirmatory PSA value 0.2 ng/mL or greater. Values obtained with different assay methods or kits cannot be used interchangeably. Results cannot be interpreted as absolute evidence of the presence or absence of malignant disease.          Passed - Valid encounter within last 12 months    Recent Outpatient Visits           3 weeks ago Nocturia   [facility] at [facility], [place]

## 2024-02-05 ENCOUNTER — Ambulatory Visit (INDEPENDENT_AMBULATORY_CARE_PROVIDER_SITE_OTHER): Admitting: Student

## 2024-02-05 ENCOUNTER — Encounter: Payer: Self-pay | Admitting: Student

## 2024-02-05 VITALS — BP 148/96 | HR 67 | Ht 72.0 in | Wt 223.0 lb

## 2024-02-05 DIAGNOSIS — N401 Enlarged prostate with lower urinary tract symptoms: Secondary | ICD-10-CM | POA: Diagnosis not present

## 2024-02-05 DIAGNOSIS — Z136 Encounter for screening for cardiovascular disorders: Secondary | ICD-10-CM | POA: Diagnosis not present

## 2024-02-05 DIAGNOSIS — R0789 Other chest pain: Secondary | ICD-10-CM | POA: Diagnosis not present

## 2024-02-05 DIAGNOSIS — I10 Essential (primary) hypertension: Secondary | ICD-10-CM

## 2024-02-05 DIAGNOSIS — R351 Nocturia: Secondary | ICD-10-CM

## 2024-02-05 DIAGNOSIS — E66811 Obesity, class 1: Secondary | ICD-10-CM

## 2024-02-05 DIAGNOSIS — Z6830 Body mass index (BMI) 30.0-30.9, adult: Secondary | ICD-10-CM

## 2024-02-05 DIAGNOSIS — E6609 Other obesity due to excess calories: Secondary | ICD-10-CM

## 2024-02-05 MED ORDER — OLMESARTAN MEDOXOMIL 20 MG PO TABS: 10.0000 mg | ORAL_TABLET | Freq: Every day | ORAL | 1 refills | Status: DC

## 2024-02-05 NOTE — Progress Notes (Signed)
 Established Patient Office Visit  Subjective   Patient ID: Matthew Levy, male    DOB: March 14, 1976  Age: 48 y.o. MRN: [id number]  Chief Complaint  Patient presents with   Hypertension    Matthew Levy with medical hx listed below presents today for follow up of hypertension. Please refer to problem based charting for further details and assessment and plan of current problem and chronic medical conditions.   Patient Active Problem List   Diagnosis Date Noted   Chest wall pain 02/08/2024   Obesity 02/08/2024   BPH (benign prostatic hyperplasia) 01/11/2024   History of colonic polyps 01/11/2024   Hypertension 06/10/2023   Rotator cuff impingement syndrome of right shoulder 05/08/2023      ROS Refer to HPI    Objective:     Outpatient Encounter Medications as of 02/05/2024  Medication Sig   olmesartan  (BENICAR ) 20 MG tablet Take 0.5 tablets (10 mg total) by mouth daily.   [DISCONTINUED] tamsulosin  (FLOMAX ) 0.4 MG CAPS capsule TAKE 1 CAPSULE BY MOUTH EVERY DAY (Patient not taking: Reported on 02/05/2024)   No facility-administered encounter medications on file as of 02/05/2024.    BP (!) 148/96   Pulse 67   Ht 6' (1.829 m)   Wt 223 lb (101.2 kg)   SpO2 97%   BMI 30.24 kg/m  BP Readings from Last 3 Encounters:  02/05/24 (!) 148/96  01/08/24 (!) 164/90  06/05/23 (!) 150/110    Physical Exam Constitutional:      Appearance: Normal appearance.  HENT:     Mouth/Throat:     Mouth: Mucous membranes are moist.     Pharynx: Oropharynx is clear.  Cardiovascular:     Rate and Rhythm: Normal rate and regular rhythm.     Pulses: Normal pulses.     Heart sounds: No murmur heard. Pulmonary:     Effort: Pulmonary effort is normal.     Breath sounds: No rhonchi or rales.  Chest:     Chest wall: Tenderness (mild at the costosterno border) present.  Abdominal:     General: Abdomen is flat. Bowel sounds are normal. There is no distension.     Palpations: Abdomen  is soft.     Tenderness: There is no abdominal tenderness.  Musculoskeletal:        General: Normal range of motion.     Right lower leg: No edema.     Left lower leg: No edema.  Skin:    General: Skin is warm and dry.     Capillary Refill: Capillary refill takes less than 2 seconds.  Neurological:     General: No focal deficit present.     Mental Status: He is alert and oriented to person, place, and time.  Psychiatric:        Mood and Affect: Mood normal.        Behavior: Behavior normal.        02/05/2024    9:27 AM 01/08/2024    3:42 PM 05/08/2023    1:44 PM  Depression screen PHQ 2/9  Decreased Interest 0 0 0  Down, Depressed, Hopeless 0 0 0  PHQ - 2 Score 0 0 0  Altered sleeping 0 3 0  Tired, decreased energy 0 0 0  Change in appetite 0 0 0  Feeling bad or failure about yourself  0 0 0  Trouble concentrating 0 0 0  Moving slowly or fidgety/restless 0 0 0  Suicidal thoughts 0 0 0  PHQ-9 Score 0  3 0  Difficult doing work/chores Not difficult at all Not difficult at all        02/05/2024    9:27 AM 01/08/2024    3:42 PM 05/08/2023    1:45 PM  GAD 7 : Generalized Anxiety Score  Nervous, Anxious, on Edge 0 0 0  Control/stop worrying 0 0 0  Worry too much - different things 0 0 0  Trouble relaxing 0 0 0  Restless 0 0 0  Easily annoyed or irritable 0 0 0  Afraid - awful might happen 0 0 0  Total GAD 7 Score 0 0 0  Anxiety Difficulty Not difficult at all Not difficult at all Not difficult at all    Results for orders placed or performed in visit on 02/05/24  Lipid Profile  Result Value Ref Range   Cholesterol, Total 176 100 - 199 mg/dL   Triglycerides 86 0 - 149 mg/dL   HDL 49 >60 mg/dL   VLDL Cholesterol Cal 16 5 - 40 mg/dL   LDL Chol Calc (NIH) 888 (H) 0 - 99 mg/dL   Chol/HDL Ratio 3.6 0.0 - 5.0 ratio    Last CBC Lab Results  Component Value Date   WBC 4.2 07/16/2015   HGB 16.2 07/16/2015   HCT 47.1 07/16/2015   MCV 86.9 07/16/2015   MCH 30.0  07/16/2015   RDW 12.5 07/16/2015   PLT 190 07/16/2015   Last metabolic panel Lab Results  Component Value Date   GLUCOSE 112 (H) 07/16/2015   NA 136 07/16/2015   K 3.5 07/16/2015   CL 103 07/16/2015   CO2 27 07/16/2015   BUN 11 07/16/2015   CREATININE 0.85 07/16/2015   GFRNONAA >60 07/16/2015   CALCIUM 8.3 (L) 07/16/2015   PROT 7.6 07/16/2015   ALBUMIN 4.1 07/16/2015   BILITOT 0.6 07/16/2015   ALKPHOS 94 07/16/2015   AST 30 07/16/2015   ALT 39 07/16/2015   ANIONGAP 6 07/16/2015      The 10-year ASCVD risk score (Arnett DK, et al., 2019) is: 3.6%    Assessment & Plan:  Encounter for screening for cardiovascular disorders -     Lipid panel  Hypertension, unspecified type Assessment & Plan: BP remains elevated today at 148/96 today. Mild improvement since last visit. Has been making dietary changes. Has been walking 2.5 miles a night or 1 mile on the treadmill, limiting salt, and limiting portions.Discussed risks and benefits of ARB for BP control. He is agreeable, he if hopeful this is will not be a long term medication. He has a strong family hx if parents and brother. -Start olmesartan  20 mg daily  -Follow up in 4 weeks -BMP at next visit -Continue diet and exercise   Chest wall pain Assessment & Plan: Sternal sharp non radiating chest pain that improves with movement and activity. Reports hx of this intermittently since 2020. Cardiac workup at that time was unrevealing, old T wave inversions, negative troponin, and CTA. Pain is atypical and does not seem cardiac in nature, likely costochondritis although only somewhat reproducible. He declined EKG today. Recommended stretches, ice/heat, and activity modification. Continue to monitor sx. ED precautions reviewed    Benign prostatic hyperplasia with nocturia Assessment & Plan: Sx have resolved, has not started flomax . Will continue monitoring for sx.    Class 1 obesity due to excess calories with serious comorbidity  and body mass index (BMI) of 30.0 to 30.9 in adult Assessment & Plan: BMI 30.23 today weight is 223lbs similar  to prior visit. Has been increasing physical activity about 15-20 minutes of walking daily and limiting portion sizes. Reports family his of cardiac disease. Lipid panel today. Continue diet and exercise.     Other orders -     Olmesartan  Medoxomil; Take 0.5 tablets (10 mg total) by mouth daily.  Dispense: 20 tablet; Refill: 1     Return in about 4 weeks (around 03/04/2024) for HTN.    Harlene Saddler, MD

## 2024-02-06 LAB — LIPID PANEL
Chol/HDL Ratio: 3.6 ratio (ref 0.0–5.0)
Cholesterol, Total: 176 mg/dL (ref 100–199)
HDL: 49 mg/dL (ref >39)
LDL Chol Calc (NIH): 111 mg/dL — ABNORMAL HIGH (ref 0–99)
Triglycerides: 86 mg/dL (ref 0–149)
VLDL Cholesterol Cal: 16 mg/dL (ref 5–40)

## 2024-02-08 ENCOUNTER — Ambulatory Visit: Payer: Self-pay | Admitting: Student

## 2024-02-08 DIAGNOSIS — R0789 Other chest pain: Secondary | ICD-10-CM | POA: Insufficient documentation

## 2024-02-08 DIAGNOSIS — E669 Obesity, unspecified: Secondary | ICD-10-CM | POA: Insufficient documentation

## 2024-02-08 NOTE — Assessment & Plan Note (Signed)
 Sternal sharp non radiating chest pain that improves with movement and activity. Reports hx of this intermittently since 2020. Cardiac workup at that time was unrevealing, old T wave inversions, negative troponin, and CTA. Pain is atypical and does not seem cardiac in nature, likely costochondritis although only somewhat reproducible. He declined EKG today. Recommended stretches, ice/heat, and activity modification. Continue to monitor sx. ED precautions reviewed

## 2024-02-08 NOTE — Assessment & Plan Note (Signed)
 Sx have resolved, has not started flomax . Will continue monitoring for sx.

## 2024-02-08 NOTE — Assessment & Plan Note (Signed)
 BP remains elevated today at 148/96 today. Mild improvement since last visit. Has been making dietary changes. Has been walking 2.5 miles a night or 1 mile on the treadmill, limiting salt, and limiting portions.Discussed risks and benefits of ARB for BP control. He is agreeable, he if hopeful this is will not be a long term medication. He has a strong family hx if parents and brother. -Start olmesartan  20 mg daily  -Follow up in 4 weeks -BMP at next visit -Continue diet and exercise

## 2024-02-08 NOTE — Assessment & Plan Note (Signed)
 BMI 30.23 today weight is 223lbs similar to prior visit. Has been increasing physical activity about 15-20 minutes of walking daily and limiting portion sizes. Reports family his of cardiac disease. Lipid panel today. Continue diet and exercise.

## 2024-03-15 ENCOUNTER — Telehealth: Payer: Self-pay | Admitting: Student

## 2024-03-15 NOTE — Telephone Encounter (Unsigned)
 Copied from CRM [id number]. Topic: Appointments - Appointment Cancel/Reschedule >> Mar 15, 2024  3:56 PM Tiffini S wrote: Patient called  to reschedule an appointment for 04/29/24 at 2:00pm- he is asking for a appointment reminder to be mailed to him to confirm appointment

## 2024-03-18 ENCOUNTER — Ambulatory Visit: Admitting: Student

## 2024-04-29 ENCOUNTER — Ambulatory Visit (INDEPENDENT_AMBULATORY_CARE_PROVIDER_SITE_OTHER): Admitting: Student

## 2024-04-29 ENCOUNTER — Encounter: Payer: Self-pay | Admitting: Student

## 2024-04-29 DIAGNOSIS — I10 Essential (primary) hypertension: Secondary | ICD-10-CM | POA: Diagnosis not present

## 2024-04-29 DIAGNOSIS — G250 Essential tremor: Secondary | ICD-10-CM | POA: Insufficient documentation

## 2024-04-29 NOTE — Progress Notes (Signed)
 Established Patient Office Visit  Subjective   Patient ID: Matthew Levy, male    DOB: [birth year]  Age: 48 y.o. MRN: [id number]  Chief Complaint  Patient presents with   Follow-up    Follow up for blood pressure and has not started medication.     Matthew Levy is a 48 y.o. person with medical hx listed below who presents today for hypertension follow up. Has not yet tried antihypertensive rx. Reports home pressures typically 140-150 over mild to high 90s. Has tried natural/herbal supplements without significant improvement, nadir 140 systolic and high 80s diastolic.  Patient Active Problem List   Diagnosis Date Noted   Essential tremor 04/29/2024   Chest wall pain 02/08/2024   Obesity 02/08/2024   BPH (benign prostatic hyperplasia) 01/11/2024   History of colonic polyps 01/11/2024   Hypertension 06/10/2023   Rotator cuff impingement syndrome of right shoulder 05/08/2023      ROS Refer to HPI    Objective:     Outpatient Encounter Medications as of 04/29/2024  Medication Sig   olmesartan  (BENICAR ) 20 MG tablet Take 0.5 tablets (10 mg total) by mouth daily.   No facility-administered encounter medications on file as of 04/29/2024.    BP (!) 144/96   Pulse 77   Ht 6' (1.829 m)   Wt 226 lb (102.5 kg)   SpO2 96%   BMI 30.65 kg/m  BP Readings from Last 3 Encounters:  04/29/24 (!) 144/96  02/05/24 (!) 148/96  01/08/24 (!) 164/90    Physical Exam Constitutional:      Appearance: Normal appearance.  HENT:     Mouth/Throat:     Mouth: Mucous membranes are moist.     Pharynx: Oropharynx is clear.  Eyes:     Extraocular Movements: Extraocular movements intact.     Pupils: Pupils are equal, round, and reactive to light.  Cardiovascular:     Rate and Rhythm: Normal rate and regular rhythm.  Pulmonary:     Effort: Pulmonary effort is normal.     Breath sounds: No rhonchi or rales.  Abdominal:     General: Abdomen is flat. Bowel sounds are normal. There  is no distension.     Palpations: Abdomen is soft.     Tenderness: There is no abdominal tenderness.  Musculoskeletal:        General: Normal range of motion.     Right lower leg: No edema.     Left lower leg: No edema.  Skin:    General: Skin is warm and dry.     Capillary Refill: Capillary refill takes less than 2 seconds.  Neurological:     General: No focal deficit present.     Mental Status: He is alert and oriented to person, place, and time.     Comments: Essential tremor   Psychiatric:        Mood and Affect: Mood normal.        Behavior: Behavior normal.        04/29/2024    1:54 PM 02/05/2024    9:27 AM 01/08/2024    3:42 PM  Depression screen PHQ 2/9  Decreased Interest 0 0 0  Down, Depressed, Hopeless 0 0 0  PHQ - 2 Score 0 0 0  Altered sleeping 0 0 3  Tired, decreased energy 0 0 0  Change in appetite 0 0 0  Feeling bad or failure about yourself  0 0 0  Trouble concentrating 0 0 0  Moving slowly or fidgety/restless  0 0 0  Suicidal thoughts 0 0 0  PHQ-9 Score 0 0  3   Difficult doing work/chores Not difficult at all Not difficult at all Not difficult at all     Data saved with a previous flowsheet row definition       04/29/2024    1:54 PM 02/05/2024    9:27 AM 01/08/2024    3:42 PM 05/08/2023    1:45 PM  GAD 7 : Generalized Anxiety Score  Nervous, Anxious, on Edge 0 0 0 0  Control/stop worrying 0 0 0 0  Worry too much - different things 0 0 0 0  Trouble relaxing 0 0 0 0  Restless 0 0 0 0  Easily annoyed or irritable 0 0 0 0  Afraid - awful might happen 0 0 0 0  Total GAD 7 Score 0 0 0 0  Anxiety Difficulty Not difficult at all Not difficult at all Not difficult at all Not difficult at all    No results found for any visits on 04/29/24.  Last CBC Lab Results  Component Value Date   WBC 4.2 07/16/2015   HGB 16.2 07/16/2015   HCT 47.1 07/16/2015   MCV 86.9 07/16/2015   MCH 30.0 07/16/2015   RDW 12.5 07/16/2015   PLT 190 07/16/2015   Last  metabolic panel Lab Results  Component Value Date   GLUCOSE 112 (H) 07/16/2015   NA 136 07/16/2015   K 3.5 07/16/2015   CL 103 07/16/2015   CO2 27 07/16/2015   BUN 11 07/16/2015   CREATININE 0.85 07/16/2015   GFRNONAA >60 07/16/2015   CALCIUM 8.3 (L) 07/16/2015   PROT 7.6 07/16/2015   ALBUMIN 4.1 07/16/2015   BILITOT 0.6 07/16/2015   ALKPHOS 94 07/16/2015   AST 30 07/16/2015   ALT 39 07/16/2015   ANIONGAP 6 07/16/2015      The 10-year ASCVD risk score (Arnett DK, et al., 2019) is: 3.4%    Assessment & Plan:  Hypertension, unspecified type Assessment & Plan: BP remains elevated today. Has not started olmesartan . His wife will pick this up today. Continue with regular walking and low salt diet. Follow up in 1 month. Labs at next visit.    Essential tremor Assessment & Plan: Reports mild tremor in both hands, is not particularly bothersome. Reports mother and daughter also have similar tremor. Reports mother's tremor significantly worsened over time. Will continue to monitor and he will let me know if tremor become more bothersome. May benefit from BB at that time.       Return in about 4 weeks (around 05/27/2024) for HTN.    Harlene Saddler, MD

## 2024-04-29 NOTE — Assessment & Plan Note (Addendum)
 BP remains elevated today. Has not started olmesartan . His wife will pick this up today. Continue with regular walking and low salt diet. Follow up in 1 month. Labs at next visit.

## 2024-04-29 NOTE — Assessment & Plan Note (Signed)
 Reports mild tremor in both hands, is not particularly bothersome. Reports mother and daughter also have similar tremor. Reports mother's tremor significantly worsened over time. Will continue to monitor and he will let me know if tremor become more bothersome. May benefit from BB at that time.

## 2024-05-06 ENCOUNTER — Ambulatory Visit

## 2024-05-06 DIAGNOSIS — D124 Benign neoplasm of descending colon: Secondary | ICD-10-CM | POA: Diagnosis not present

## 2024-05-06 DIAGNOSIS — K635 Polyp of colon: Secondary | ICD-10-CM | POA: Diagnosis not present

## 2024-05-06 DIAGNOSIS — Z860101 Personal history of adenomatous and serrated colon polyps: Secondary | ICD-10-CM | POA: Diagnosis not present

## 2024-05-06 DIAGNOSIS — Z09 Encounter for follow-up examination after completed treatment for conditions other than malignant neoplasm: Secondary | ICD-10-CM | POA: Diagnosis present

## 2024-05-20 ENCOUNTER — Other Ambulatory Visit: Payer: Self-pay | Admitting: Student

## 2024-05-20 NOTE — Telephone Encounter (Signed)
 Requested Prescriptions  Pending Prescriptions Disp Refills   olmesartan  (BENICAR ) 20 MG tablet [Pharmacy Med Name: OLMESARTAN  MEDOXOMIL 20 MG TAB] 45 tablet 0    Sig: TAKE 1/2 TABLET BY MOUTH DAILY     Cardiovascular:  Angiotensin Receptor Blockers Failed - 05/20/2024  4:21 PM      Failed - Cr in normal range and within 180 days    Creatinine, Ser  Date Value Ref Range Status  07/16/2015 0.85 0.61 - 1.24 mg/dL Final         Failed - K in normal range and within 180 days    Potassium  Date Value Ref Range Status  07/16/2015 3.5 3.5 - 5.1 mmol/L Final         Failed - Last BP in normal range    BP Readings from Last 1 Encounters:  04/29/24 (!) 144/96         Passed - Patient is not pregnant      Passed - Valid encounter within last 6 months    Recent Outpatient Visits           3 weeks ago Hypertension, unspecified type   [facility] at [facility], [place]   3 months ago Encounter for screening for cardiovascular disorders   [facility] [facility] at [facility], [place]   4 months ago Nocturia   [facility] at [place], [place]

## 2024-06-10 ENCOUNTER — Ambulatory Visit: Admitting: Student

## 2024-07-08 ENCOUNTER — Ambulatory Visit: Admitting: Student
